# Patient Record
Sex: Female | Born: 1945
Health system: Southern US, Community
[De-identification: ages and names within clinical notes are randomized; demographics above are authoritative.]

## PROBLEM LIST (undated history)

## (undated) DIAGNOSIS — J40 Bronchitis, not specified as acute or chronic: Secondary | ICD-10-CM

## (undated) DIAGNOSIS — F329 Major depressive disorder, single episode, unspecified: Secondary | ICD-10-CM

## (undated) DIAGNOSIS — M797 Fibromyalgia: Secondary | ICD-10-CM

## (undated) DIAGNOSIS — F32A Depression, unspecified: Secondary | ICD-10-CM

## (undated) HISTORY — DX: Major depressive disorder, single episode, unspecified: F32.9

## (undated) HISTORY — DX: Bronchitis, not specified as acute or chronic: J40

## (undated) HISTORY — DX: Depression, unspecified: F32.A

---

## 1980-12-02 HISTORY — PX: TOTAL ABDOMINAL HYSTERECTOMY: SHX209

## 1998-08-09 ENCOUNTER — Other Ambulatory Visit: Admission: RE | Admit: 1998-08-09 | Discharge: 1998-08-09 | Payer: Self-pay | Admitting: Gynecology

## 2000-05-05 ENCOUNTER — Encounter: Admission: RE | Admit: 2000-05-05 | Discharge: 2000-05-05 | Payer: Self-pay | Admitting: Gynecology

## 2000-05-05 ENCOUNTER — Encounter: Payer: Self-pay | Admitting: Gynecology

## 2000-11-10 ENCOUNTER — Other Ambulatory Visit: Admission: RE | Admit: 2000-11-10 | Discharge: 2000-11-10 | Payer: Self-pay | Admitting: Gynecology

## 2001-02-13 ENCOUNTER — Ambulatory Visit (HOSPITAL_COMMUNITY): Admission: RE | Admit: 2001-02-13 | Discharge: 2001-02-13 | Payer: Self-pay | Admitting: Gastroenterology

## 2001-06-11 ENCOUNTER — Encounter: Admission: RE | Admit: 2001-06-11 | Discharge: 2001-06-11 | Payer: Self-pay | Admitting: Gynecology

## 2001-06-11 ENCOUNTER — Encounter: Payer: Self-pay | Admitting: Gynecology

## 2001-11-11 ENCOUNTER — Other Ambulatory Visit: Admission: RE | Admit: 2001-11-11 | Discharge: 2001-11-11 | Payer: Self-pay | Admitting: Gynecology

## 2002-07-02 ENCOUNTER — Encounter: Payer: Self-pay | Admitting: Gynecology

## 2002-07-02 ENCOUNTER — Encounter: Admission: RE | Admit: 2002-07-02 | Discharge: 2002-07-02 | Payer: Self-pay | Admitting: Gynecology

## 2002-12-15 ENCOUNTER — Other Ambulatory Visit: Admission: RE | Admit: 2002-12-15 | Discharge: 2002-12-15 | Payer: Self-pay | Admitting: Gynecology

## 2004-01-17 ENCOUNTER — Other Ambulatory Visit: Admission: RE | Admit: 2004-01-17 | Discharge: 2004-01-17 | Payer: Self-pay | Admitting: Gynecology

## 2005-04-25 ENCOUNTER — Other Ambulatory Visit: Admission: RE | Admit: 2005-04-25 | Discharge: 2005-04-25 | Payer: Self-pay | Admitting: Gynecology

## 2005-06-03 ENCOUNTER — Encounter: Admission: RE | Admit: 2005-06-03 | Discharge: 2005-06-03 | Payer: Self-pay | Admitting: Gynecology

## 2006-06-10 ENCOUNTER — Other Ambulatory Visit: Admission: RE | Admit: 2006-06-10 | Discharge: 2006-06-10 | Payer: Self-pay | Admitting: Gynecology

## 2006-07-02 ENCOUNTER — Encounter: Admission: RE | Admit: 2006-07-02 | Discharge: 2006-07-02 | Payer: Self-pay | Admitting: Gynecology

## 2006-12-02 HISTORY — PX: HAND SURGERY: SHX662

## 2007-04-19 ENCOUNTER — Emergency Department (HOSPITAL_COMMUNITY): Admission: EM | Admit: 2007-04-19 | Discharge: 2007-04-19 | Payer: Self-pay | Admitting: Emergency Medicine

## 2007-08-24 ENCOUNTER — Encounter: Admission: RE | Admit: 2007-08-24 | Discharge: 2007-08-24 | Payer: Self-pay | Admitting: Gynecology

## 2008-09-08 ENCOUNTER — Encounter: Admission: RE | Admit: 2008-09-08 | Discharge: 2008-09-08 | Payer: Self-pay | Admitting: Gynecology

## 2009-05-24 ENCOUNTER — Encounter: Admission: RE | Admit: 2009-05-24 | Discharge: 2009-05-24 | Payer: Self-pay | Admitting: Family Medicine

## 2009-09-14 ENCOUNTER — Encounter: Admission: RE | Admit: 2009-09-14 | Discharge: 2009-09-14 | Payer: Self-pay | Admitting: Gynecology

## 2010-10-05 ENCOUNTER — Encounter: Admission: RE | Admit: 2010-10-05 | Discharge: 2010-10-05 | Payer: Self-pay | Admitting: Gynecology

## 2010-12-02 HISTORY — PX: HAND SURGERY: SHX662

## 2010-12-24 ENCOUNTER — Encounter: Payer: Self-pay | Admitting: Gynecology

## 2011-04-19 NOTE — Procedures (Signed)
[place]  Patient:    Julie Duran, Julie Duran                      MRN: [id number] Proc. Date: 02/13/01 Adm. Date:  [id number] Attending:  Charna Elizabeth CC:         Dario Guardian, M.D.   Procedure Report  DATE OF BIRTH:  09-09-1946.  PROCEDURE:  Colonoscopy.  ENDOSCOPIST:  Anselmo Rod, M.D.  INSTRUMENTS USED:  Olympus video colonoscope.  INDICATIONS:  Guaiac positive stools with a history of rectal bleeding x 41 in a 65 year old white female with a family history of colon cancer in a first degree relative (father), rule out colonic polyps, masses, hemorrhoids, etc.  INFORMED CONSENT:  Informed consent was procured from the patient.  PREPROCEDURE PREPARATION:  The patient was fasted for eight hours prior to the procedure after being prepped with a bottle of magnesium citrate and a gallon of NuLytely the night prior to the procedure.  PREPROCEDURE PHYSICAL EXAMINATION:  VITAL SIGNS:  The patient had stable vital signs.  NECK:  Neck is supple.  CHEST:  Clear to auscultation.  S1, S2 regular.  ABDOMEN:  Soft with normal abdominal bowel sounds.  DESCRIPTION OF PROCEDURE:  The patient was placed in the left lateral decubitus position and sedated with 50 mg of Demerol and 5 mg of Versed intravenously.  Once the patient was adequately sedated and maintained on low flow oxygen, and continuous cardiac monitoring, the Olympus video colonoscope was advanced from the rectum to the cecum without difficulty.  The patient had a fairly good prep.  The appendicular orifice and ileocecal valve were clearly visualized and photographed.  No masses, polyps, erosions, ulcerations or diverticular were seen.  There were small internal hemorrhoids seen on retroflexion in the rectum.  IMPRESSION:  Normal colonoscopy except for small nonbleeding internal hemorrhoids.  RECOMMENDATIONS: 1. The patient has been advised to increase her fluid and fiber in  the diet. 2. Repeat colorectal cancer screening is recommended in the next 5 years    unless she were to develop any abnormal symptoms in the interim. 3. Outpatient followup is advised on a p.r.n. basis. DD:  02/13/01 TD:  02/13/01 Job: [id number]

## 2011-08-22 ENCOUNTER — Ambulatory Visit
Admission: RE | Admit: 2011-08-22 | Discharge: 2011-08-22 | Disposition: A | Discharge status: Home / Self Care | Payer: Medicare Other | Source: Ambulatory Visit | Attending: Family Medicine | Admitting: Family Medicine

## 2011-08-22 ENCOUNTER — Other Ambulatory Visit: Payer: Self-pay | Admitting: Family Medicine

## 2011-08-22 DIAGNOSIS — R109 Unspecified abdominal pain: Secondary | ICD-10-CM

## 2011-12-17 ENCOUNTER — Other Ambulatory Visit: Payer: Self-pay | Admitting: Gynecology

## 2011-12-17 DIAGNOSIS — Z1231 Encounter for screening mammogram for malignant neoplasm of breast: Secondary | ICD-10-CM

## 2012-01-01 ENCOUNTER — Ambulatory Visit
Admission: RE | Admit: 2012-01-01 | Discharge: 2012-01-01 | Disposition: A | Discharge status: Home / Self Care | Payer: Medicare Other | Source: Ambulatory Visit | Attending: Gynecology | Admitting: Gynecology

## 2012-01-01 DIAGNOSIS — Z1231 Encounter for screening mammogram for malignant neoplasm of breast: Secondary | ICD-10-CM

## 2012-02-27 DIAGNOSIS — H269 Unspecified cataract: Secondary | ICD-10-CM | POA: Diagnosis not present

## 2012-04-27 DIAGNOSIS — J019 Acute sinusitis, unspecified: Secondary | ICD-10-CM | POA: Diagnosis not present

## 2012-05-06 DIAGNOSIS — M25569 Pain in unspecified knee: Secondary | ICD-10-CM | POA: Diagnosis not present

## 2012-05-06 DIAGNOSIS — M25579 Pain in unspecified ankle and joints of unspecified foot: Secondary | ICD-10-CM | POA: Diagnosis not present

## 2012-08-06 DIAGNOSIS — S63639A Sprain of interphalangeal joint of unspecified finger, initial encounter: Secondary | ICD-10-CM | POA: Diagnosis not present

## 2012-08-20 DIAGNOSIS — S63639A Sprain of interphalangeal joint of unspecified finger, initial encounter: Secondary | ICD-10-CM | POA: Diagnosis not present

## 2012-09-14 DIAGNOSIS — Z23 Encounter for immunization: Secondary | ICD-10-CM | POA: Diagnosis not present

## 2012-09-14 DIAGNOSIS — IMO0001 Reserved for inherently not codable concepts without codable children: Secondary | ICD-10-CM | POA: Diagnosis not present

## 2012-09-14 DIAGNOSIS — E785 Hyperlipidemia, unspecified: Secondary | ICD-10-CM | POA: Diagnosis not present

## 2012-09-14 DIAGNOSIS — F329 Major depressive disorder, single episode, unspecified: Secondary | ICD-10-CM | POA: Diagnosis not present

## 2012-09-30 DIAGNOSIS — E2839 Other primary ovarian failure: Secondary | ICD-10-CM | POA: Diagnosis not present

## 2012-09-30 DIAGNOSIS — Z01419 Encounter for gynecological examination (general) (routine) without abnormal findings: Secondary | ICD-10-CM | POA: Diagnosis not present

## 2012-09-30 DIAGNOSIS — F329 Major depressive disorder, single episode, unspecified: Secondary | ICD-10-CM | POA: Diagnosis not present

## 2012-12-29 ENCOUNTER — Other Ambulatory Visit: Payer: Self-pay | Admitting: Gynecology

## 2012-12-29 DIAGNOSIS — Z1231 Encounter for screening mammogram for malignant neoplasm of breast: Secondary | ICD-10-CM

## 2013-01-22 ENCOUNTER — Ambulatory Visit
Admission: RE | Admit: 2013-01-22 | Discharge: 2013-01-22 | Disposition: A | Discharge status: Home / Self Care | Payer: Medicare Other | Source: Ambulatory Visit | Attending: Gynecology | Admitting: Gynecology

## 2013-01-22 DIAGNOSIS — Z1231 Encounter for screening mammogram for malignant neoplasm of breast: Secondary | ICD-10-CM

## 2013-03-04 ENCOUNTER — Other Ambulatory Visit: Payer: Self-pay

## 2013-03-04 DIAGNOSIS — Z1231 Encounter for screening mammogram for malignant neoplasm of breast: Secondary | ICD-10-CM

## 2013-04-07 ENCOUNTER — Ambulatory Visit
Admission: RE | Admit: 2013-04-07 | Discharge: 2013-04-07 | Disposition: A | Discharge status: Home / Self Care | Payer: Medicare Other | Source: Ambulatory Visit

## 2013-04-07 ENCOUNTER — Other Ambulatory Visit: Payer: Self-pay | Admitting: Family Medicine

## 2013-04-07 DIAGNOSIS — Z1231 Encounter for screening mammogram for malignant neoplasm of breast: Secondary | ICD-10-CM | POA: Diagnosis not present

## 2013-04-07 DIAGNOSIS — R928 Other abnormal and inconclusive findings on diagnostic imaging of breast: Secondary | ICD-10-CM

## 2013-04-12 DIAGNOSIS — L74519 Primary focal hyperhidrosis, unspecified: Secondary | ICD-10-CM | POA: Diagnosis not present

## 2013-04-12 DIAGNOSIS — B354 Tinea corporis: Secondary | ICD-10-CM | POA: Diagnosis not present

## 2013-04-20 ENCOUNTER — Other Ambulatory Visit: Payer: Medicare Other

## 2013-04-21 ENCOUNTER — Ambulatory Visit
Admission: RE | Admit: 2013-04-21 | Discharge: 2013-04-21 | Disposition: A | Discharge status: Home / Self Care | Payer: Medicare Other | Source: Ambulatory Visit | Attending: Family Medicine | Admitting: Family Medicine

## 2013-04-21 DIAGNOSIS — R928 Other abnormal and inconclusive findings on diagnostic imaging of breast: Secondary | ICD-10-CM | POA: Diagnosis not present

## 2013-07-29 DIAGNOSIS — H524 Presbyopia: Secondary | ICD-10-CM | POA: Diagnosis not present

## 2013-07-29 DIAGNOSIS — H269 Unspecified cataract: Secondary | ICD-10-CM | POA: Diagnosis not present

## 2013-07-29 DIAGNOSIS — H52229 Regular astigmatism, unspecified eye: Secondary | ICD-10-CM | POA: Diagnosis not present

## 2013-07-29 DIAGNOSIS — H5210 Myopia, unspecified eye: Secondary | ICD-10-CM | POA: Diagnosis not present

## 2013-08-02 DIAGNOSIS — IMO0001 Reserved for inherently not codable concepts without codable children: Secondary | ICD-10-CM | POA: Diagnosis not present

## 2013-08-31 DIAGNOSIS — IMO0001 Reserved for inherently not codable concepts without codable children: Secondary | ICD-10-CM | POA: Diagnosis not present

## 2013-08-31 DIAGNOSIS — F329 Major depressive disorder, single episode, unspecified: Secondary | ICD-10-CM | POA: Diagnosis not present

## 2013-08-31 DIAGNOSIS — E785 Hyperlipidemia, unspecified: Secondary | ICD-10-CM | POA: Diagnosis not present

## 2013-09-09 DIAGNOSIS — L259 Unspecified contact dermatitis, unspecified cause: Secondary | ICD-10-CM | POA: Diagnosis not present

## 2013-09-09 DIAGNOSIS — L821 Other seborrheic keratosis: Secondary | ICD-10-CM | POA: Diagnosis not present

## 2013-09-15 DIAGNOSIS — Z23 Encounter for immunization: Secondary | ICD-10-CM | POA: Diagnosis not present

## 2013-09-15 DIAGNOSIS — B373 Candidiasis of vulva and vagina: Secondary | ICD-10-CM | POA: Diagnosis not present

## 2013-12-15 DIAGNOSIS — M899 Disorder of bone, unspecified: Secondary | ICD-10-CM | POA: Diagnosis not present

## 2013-12-15 DIAGNOSIS — N951 Menopausal and female climacteric states: Secondary | ICD-10-CM | POA: Diagnosis not present

## 2013-12-15 DIAGNOSIS — Z01419 Encounter for gynecological examination (general) (routine) without abnormal findings: Secondary | ICD-10-CM | POA: Diagnosis not present

## 2013-12-15 DIAGNOSIS — Z79890 Hormone replacement therapy (postmenopausal): Secondary | ICD-10-CM | POA: Diagnosis not present

## 2014-04-20 DIAGNOSIS — F329 Major depressive disorder, single episode, unspecified: Secondary | ICD-10-CM | POA: Diagnosis not present

## 2014-04-20 DIAGNOSIS — Z0000 Encounter for general adult medical examination without abnormal findings: Secondary | ICD-10-CM | POA: Diagnosis not present

## 2014-04-20 DIAGNOSIS — E785 Hyperlipidemia, unspecified: Secondary | ICD-10-CM | POA: Diagnosis not present

## 2014-04-20 DIAGNOSIS — R5381 Other malaise: Secondary | ICD-10-CM | POA: Diagnosis not present

## 2014-04-20 DIAGNOSIS — IMO0001 Reserved for inherently not codable concepts without codable children: Secondary | ICD-10-CM | POA: Diagnosis not present

## 2014-04-20 DIAGNOSIS — F3289 Other specified depressive episodes: Secondary | ICD-10-CM | POA: Diagnosis not present

## 2014-04-29 ENCOUNTER — Other Ambulatory Visit: Payer: Self-pay

## 2014-04-29 DIAGNOSIS — Z1231 Encounter for screening mammogram for malignant neoplasm of breast: Secondary | ICD-10-CM

## 2014-05-04 ENCOUNTER — Ambulatory Visit
Admission: RE | Admit: 2014-05-04 | Discharge: 2014-05-04 | Disposition: A | Discharge status: Home / Self Care | Payer: Medicare Other | Source: Ambulatory Visit

## 2014-05-04 DIAGNOSIS — Z1231 Encounter for screening mammogram for malignant neoplasm of breast: Secondary | ICD-10-CM

## 2014-05-09 IMAGING — MG MM DIGITAL SCREENING BILAT
4 series · 4 of 4 positions shown · non-contrast
Comparison: compared with prior film

CLINICAL DATA: Screening.

DIGITAL BILATERAL SCREENING MAMMOGRAM WITH CAD

[R CC]
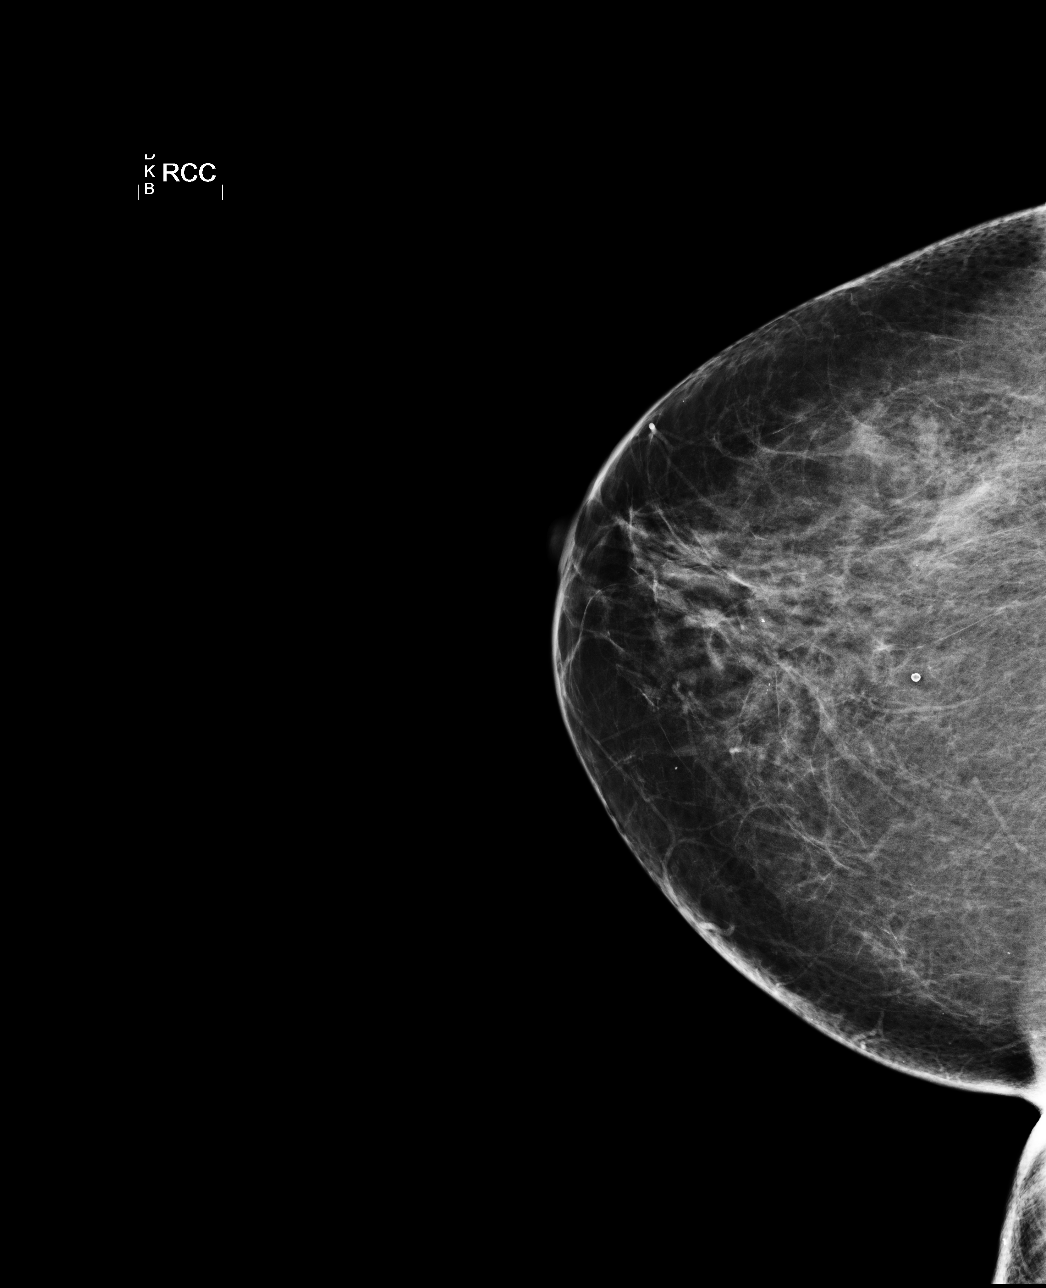

[L CC]
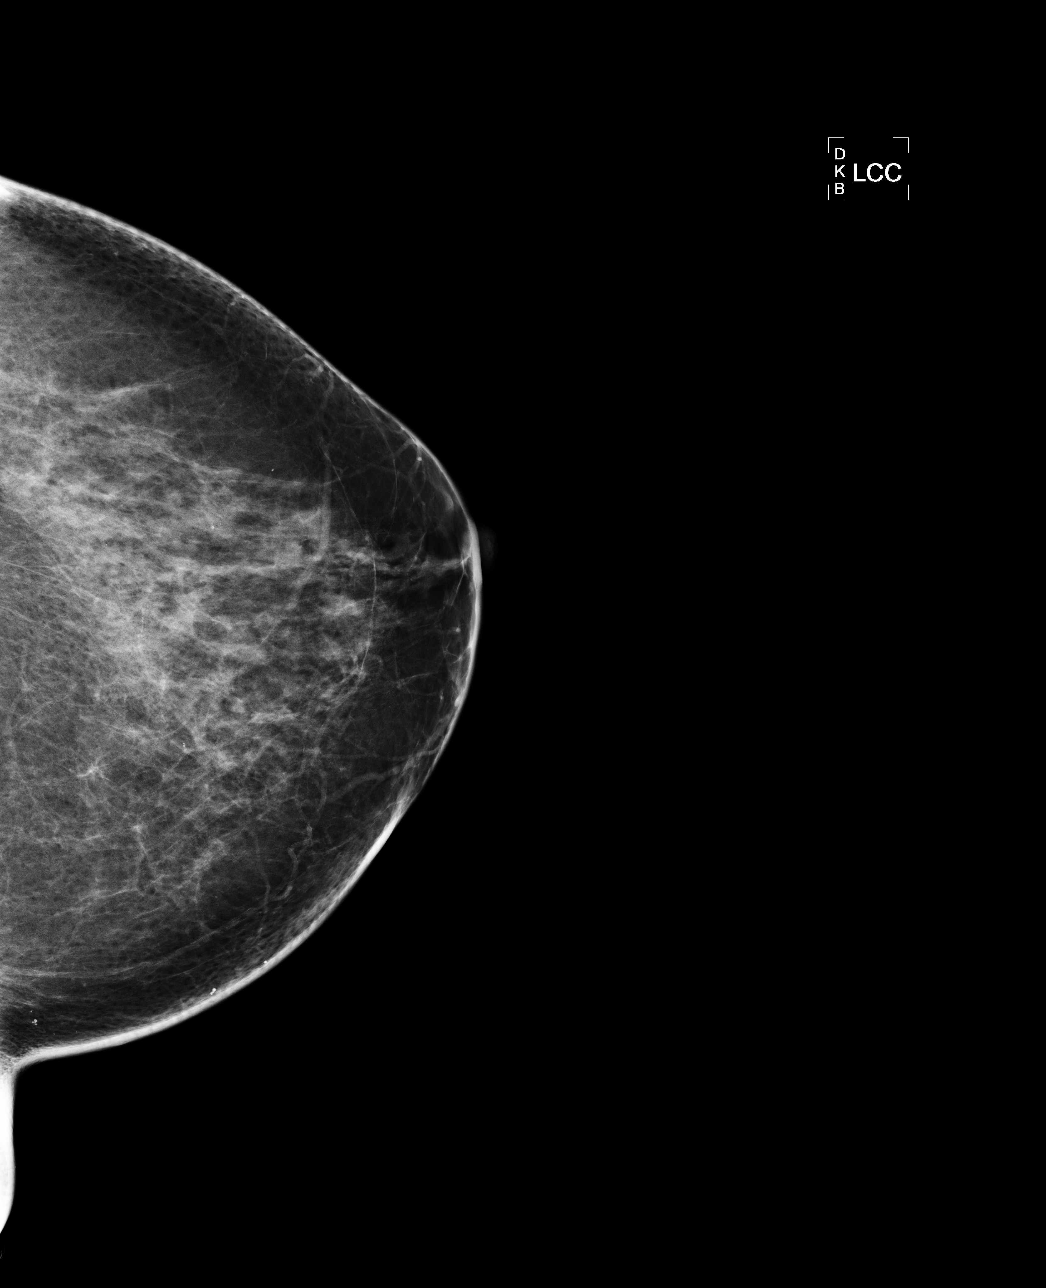

[L MLO]
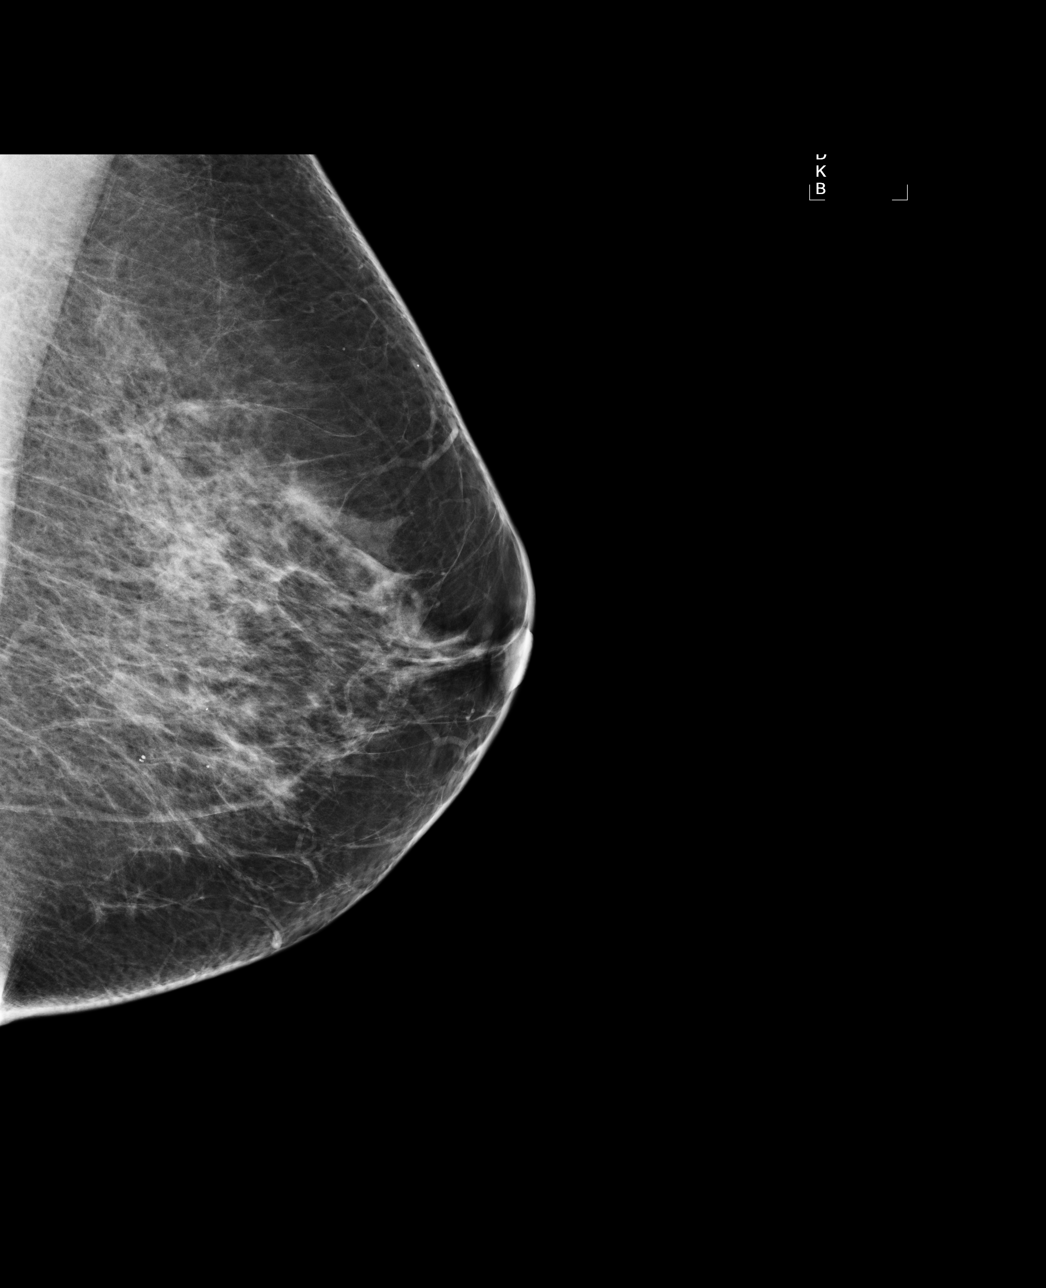

[R MLO]
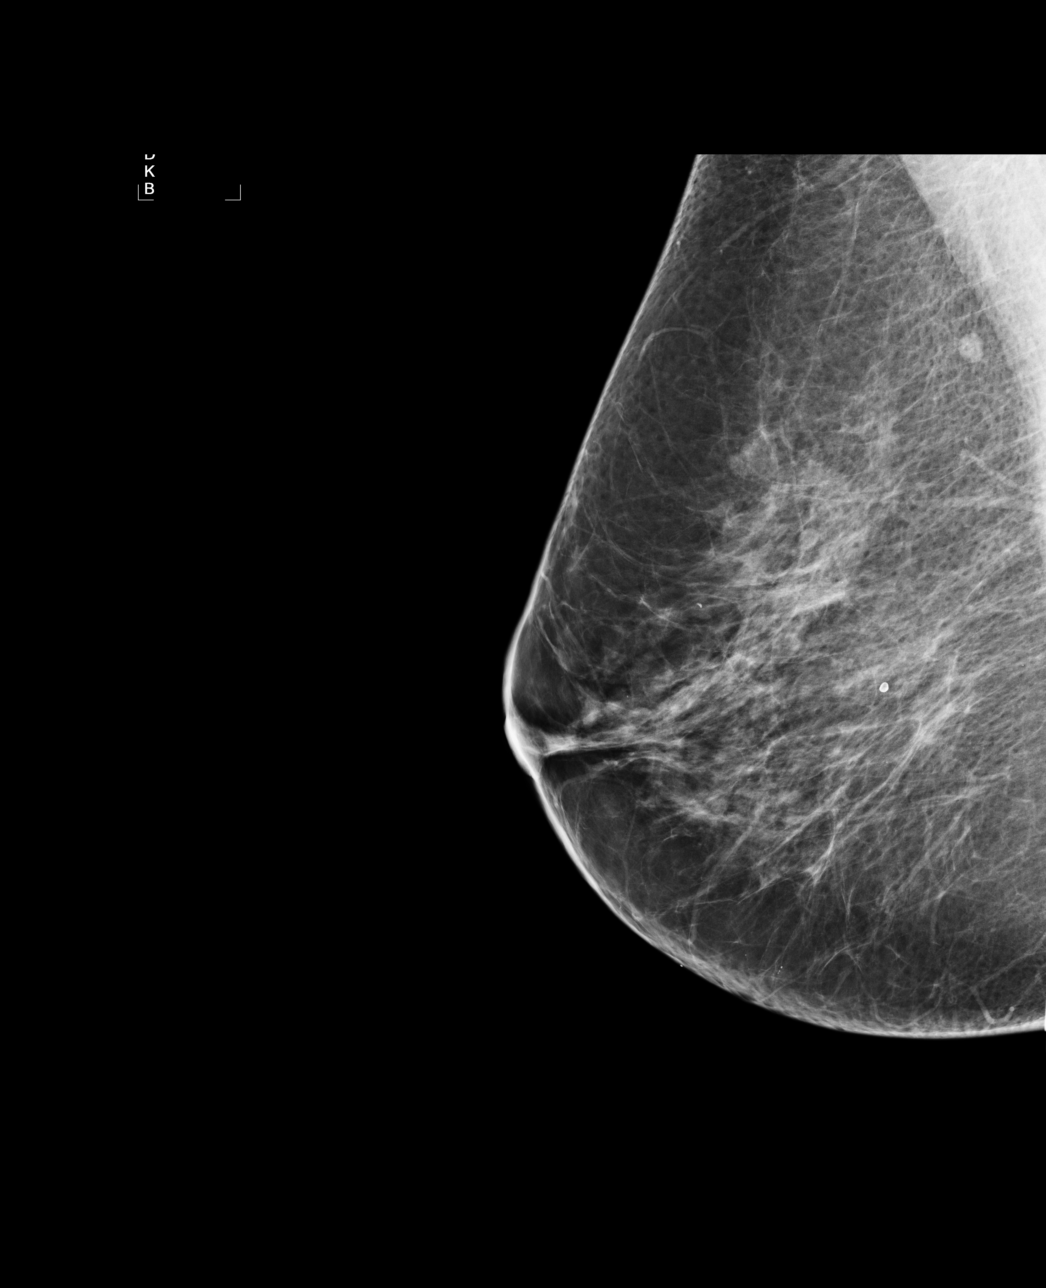

[4 of 4 positions shown; findings below may reference images not displayed]

FINDINGS: ACR Breast Density Category 3:The breast tissue is heterogeneously
dense.

In the right breast, a possible mass warrants further evaluation
with spot compression views and possibly ultrasound.  In the left
breast, no masses or malignant type calcifications are identified.

Images were processed with CAD.
IMPRESSION: Further evaluation is suggested for possible mass in the right
breast.

RECOMMENDATION:
Diagnostic mammogram and possibly ultrasound of the right breast.
(Code:[id number])

The patient will be contacted regarding the findings, and
additional imaging will be scheduled.

BI-RADS CATEGORY 0:  Incomplete.  Need additional imaging
evaluation and/or prior mammograms for comparison.

## 2014-05-31 DIAGNOSIS — M25569 Pain in unspecified knee: Secondary | ICD-10-CM | POA: Diagnosis not present

## 2014-07-06 DIAGNOSIS — Z780 Asymptomatic menopausal state: Secondary | ICD-10-CM | POA: Diagnosis not present

## 2014-07-06 DIAGNOSIS — N951 Menopausal and female climacteric states: Secondary | ICD-10-CM | POA: Diagnosis not present

## 2014-09-28 DIAGNOSIS — Z1211 Encounter for screening for malignant neoplasm of colon: Secondary | ICD-10-CM | POA: Diagnosis not present

## 2015-01-18 DIAGNOSIS — M797 Fibromyalgia: Secondary | ICD-10-CM | POA: Diagnosis not present

## 2015-01-18 DIAGNOSIS — E785 Hyperlipidemia, unspecified: Secondary | ICD-10-CM | POA: Diagnosis not present

## 2015-01-18 DIAGNOSIS — F329 Major depressive disorder, single episode, unspecified: Secondary | ICD-10-CM | POA: Diagnosis not present

## 2015-01-24 DIAGNOSIS — H2513 Age-related nuclear cataract, bilateral: Secondary | ICD-10-CM | POA: Diagnosis not present

## 2015-06-28 DIAGNOSIS — Z79890 Hormone replacement therapy (postmenopausal): Secondary | ICD-10-CM | POA: Diagnosis not present

## 2015-06-28 DIAGNOSIS — Z780 Asymptomatic menopausal state: Secondary | ICD-10-CM | POA: Diagnosis not present

## 2015-06-28 DIAGNOSIS — Z01419 Encounter for gynecological examination (general) (routine) without abnormal findings: Secondary | ICD-10-CM | POA: Diagnosis not present

## 2015-06-28 DIAGNOSIS — Z1231 Encounter for screening mammogram for malignant neoplasm of breast: Secondary | ICD-10-CM | POA: Diagnosis not present

## 2015-10-03 DIAGNOSIS — Z0001 Encounter for general adult medical examination with abnormal findings: Secondary | ICD-10-CM | POA: Diagnosis not present

## 2015-10-03 DIAGNOSIS — F329 Major depressive disorder, single episode, unspecified: Secondary | ICD-10-CM | POA: Diagnosis not present

## 2015-10-03 DIAGNOSIS — M797 Fibromyalgia: Secondary | ICD-10-CM | POA: Diagnosis not present

## 2015-10-03 DIAGNOSIS — E785 Hyperlipidemia, unspecified: Secondary | ICD-10-CM | POA: Diagnosis not present

## 2015-10-03 DIAGNOSIS — S8011XA Contusion of right lower leg, initial encounter: Secondary | ICD-10-CM | POA: Diagnosis not present

## 2015-10-03 DIAGNOSIS — Z1389 Encounter for screening for other disorder: Secondary | ICD-10-CM | POA: Diagnosis not present

## 2015-10-03 DIAGNOSIS — Z23 Encounter for immunization: Secondary | ICD-10-CM | POA: Diagnosis not present

## 2016-01-22 DIAGNOSIS — H0012 Chalazion right lower eyelid: Secondary | ICD-10-CM | POA: Diagnosis not present

## 2016-02-15 DIAGNOSIS — N9089 Other specified noninflammatory disorders of vulva and perineum: Secondary | ICD-10-CM | POA: Diagnosis not present

## 2016-02-15 DIAGNOSIS — R3915 Urgency of urination: Secondary | ICD-10-CM | POA: Diagnosis not present

## 2016-03-07 DIAGNOSIS — L570 Actinic keratosis: Secondary | ICD-10-CM | POA: Diagnosis not present

## 2016-03-07 DIAGNOSIS — D1801 Hemangioma of skin and subcutaneous tissue: Secondary | ICD-10-CM | POA: Diagnosis not present

## 2016-03-07 DIAGNOSIS — L821 Other seborrheic keratosis: Secondary | ICD-10-CM | POA: Diagnosis not present

## 2016-03-07 DIAGNOSIS — L812 Freckles: Secondary | ICD-10-CM | POA: Diagnosis not present

## 2016-03-07 DIAGNOSIS — L308 Other specified dermatitis: Secondary | ICD-10-CM | POA: Diagnosis not present

## 2016-05-08 DIAGNOSIS — R944 Abnormal results of kidney function studies: Secondary | ICD-10-CM | POA: Diagnosis not present

## 2016-05-08 DIAGNOSIS — F325 Major depressive disorder, single episode, in full remission: Secondary | ICD-10-CM | POA: Diagnosis not present

## 2016-05-08 DIAGNOSIS — M797 Fibromyalgia: Secondary | ICD-10-CM | POA: Diagnosis not present

## 2016-05-08 DIAGNOSIS — E785 Hyperlipidemia, unspecified: Secondary | ICD-10-CM | POA: Diagnosis not present

## 2016-08-01 DIAGNOSIS — H5213 Myopia, bilateral: Secondary | ICD-10-CM | POA: Diagnosis not present

## 2016-08-01 DIAGNOSIS — H524 Presbyopia: Secondary | ICD-10-CM | POA: Diagnosis not present

## 2016-08-01 DIAGNOSIS — H2513 Age-related nuclear cataract, bilateral: Secondary | ICD-10-CM | POA: Diagnosis not present

## 2016-08-22 DIAGNOSIS — Z1211 Encounter for screening for malignant neoplasm of colon: Secondary | ICD-10-CM | POA: Diagnosis not present

## 2016-08-22 DIAGNOSIS — R142 Eructation: Secondary | ICD-10-CM | POA: Diagnosis not present

## 2016-08-22 DIAGNOSIS — Z800 Family history of malignant neoplasm of digestive organs: Secondary | ICD-10-CM | POA: Diagnosis not present

## 2016-08-22 DIAGNOSIS — K5900 Constipation, unspecified: Secondary | ICD-10-CM | POA: Diagnosis not present

## 2016-09-02 DIAGNOSIS — Z1211 Encounter for screening for malignant neoplasm of colon: Secondary | ICD-10-CM | POA: Diagnosis not present

## 2016-09-02 DIAGNOSIS — Z800 Family history of malignant neoplasm of digestive organs: Secondary | ICD-10-CM | POA: Diagnosis not present

## 2016-09-02 DIAGNOSIS — K5730 Diverticulosis of large intestine without perforation or abscess without bleeding: Secondary | ICD-10-CM | POA: Diagnosis not present

## 2016-09-03 DIAGNOSIS — Z79890 Hormone replacement therapy (postmenopausal): Secondary | ICD-10-CM | POA: Diagnosis not present

## 2016-09-03 DIAGNOSIS — Z780 Asymptomatic menopausal state: Secondary | ICD-10-CM | POA: Diagnosis not present

## 2016-09-03 DIAGNOSIS — Z01419 Encounter for gynecological examination (general) (routine) without abnormal findings: Secondary | ICD-10-CM | POA: Diagnosis not present

## 2016-09-03 DIAGNOSIS — Z1231 Encounter for screening mammogram for malignant neoplasm of breast: Secondary | ICD-10-CM | POA: Diagnosis not present

## 2016-09-17 DIAGNOSIS — J029 Acute pharyngitis, unspecified: Secondary | ICD-10-CM | POA: Diagnosis not present

## 2016-11-12 DIAGNOSIS — E785 Hyperlipidemia, unspecified: Secondary | ICD-10-CM | POA: Diagnosis not present

## 2016-11-12 DIAGNOSIS — F5101 Primary insomnia: Secondary | ICD-10-CM | POA: Diagnosis not present

## 2016-11-12 DIAGNOSIS — M797 Fibromyalgia: Secondary | ICD-10-CM | POA: Diagnosis not present

## 2017-03-10 DIAGNOSIS — L438 Other lichen planus: Secondary | ICD-10-CM | POA: Diagnosis not present

## 2017-03-10 DIAGNOSIS — D2271 Melanocytic nevi of right lower limb, including hip: Secondary | ICD-10-CM | POA: Diagnosis not present

## 2017-03-10 DIAGNOSIS — L812 Freckles: Secondary | ICD-10-CM | POA: Diagnosis not present

## 2017-03-10 DIAGNOSIS — L821 Other seborrheic keratosis: Secondary | ICD-10-CM | POA: Diagnosis not present

## 2017-03-10 DIAGNOSIS — D1801 Hemangioma of skin and subcutaneous tissue: Secondary | ICD-10-CM | POA: Diagnosis not present

## 2017-03-10 DIAGNOSIS — L74512 Primary focal hyperhidrosis, palms: Secondary | ICD-10-CM | POA: Diagnosis not present

## 2017-04-02 DIAGNOSIS — M797 Fibromyalgia: Secondary | ICD-10-CM | POA: Diagnosis not present

## 2017-04-02 DIAGNOSIS — F325 Major depressive disorder, single episode, in full remission: Secondary | ICD-10-CM | POA: Diagnosis not present

## 2017-04-02 DIAGNOSIS — M25551 Pain in right hip: Secondary | ICD-10-CM | POA: Diagnosis not present

## 2017-05-13 DIAGNOSIS — F325 Major depressive disorder, single episode, in full remission: Secondary | ICD-10-CM | POA: Diagnosis not present

## 2017-05-13 DIAGNOSIS — M797 Fibromyalgia: Secondary | ICD-10-CM | POA: Diagnosis not present

## 2017-05-13 DIAGNOSIS — F5101 Primary insomnia: Secondary | ICD-10-CM | POA: Diagnosis not present

## 2017-05-23 DIAGNOSIS — R32 Unspecified urinary incontinence: Secondary | ICD-10-CM | POA: Diagnosis not present

## 2017-05-23 DIAGNOSIS — N3289 Other specified disorders of bladder: Secondary | ICD-10-CM | POA: Diagnosis not present

## 2017-05-23 DIAGNOSIS — R390 Extravasation of urine: Secondary | ICD-10-CM | POA: Diagnosis not present

## 2017-08-06 DIAGNOSIS — M7062 Trochanteric bursitis, left hip: Secondary | ICD-10-CM | POA: Diagnosis not present

## 2017-08-06 DIAGNOSIS — M7061 Trochanteric bursitis, right hip: Secondary | ICD-10-CM | POA: Diagnosis not present

## 2017-08-12 DIAGNOSIS — M7061 Trochanteric bursitis, right hip: Secondary | ICD-10-CM | POA: Diagnosis not present

## 2017-08-12 DIAGNOSIS — M7062 Trochanteric bursitis, left hip: Secondary | ICD-10-CM | POA: Diagnosis not present

## 2017-08-12 DIAGNOSIS — M5416 Radiculopathy, lumbar region: Secondary | ICD-10-CM | POA: Diagnosis not present

## 2017-08-18 DIAGNOSIS — M5416 Radiculopathy, lumbar region: Secondary | ICD-10-CM | POA: Diagnosis not present

## 2017-08-21 DIAGNOSIS — H5213 Myopia, bilateral: Secondary | ICD-10-CM | POA: Diagnosis not present

## 2017-08-21 DIAGNOSIS — H52221 Regular astigmatism, right eye: Secondary | ICD-10-CM | POA: Diagnosis not present

## 2017-08-21 DIAGNOSIS — H524 Presbyopia: Secondary | ICD-10-CM | POA: Diagnosis not present

## 2017-08-21 DIAGNOSIS — H2513 Age-related nuclear cataract, bilateral: Secondary | ICD-10-CM | POA: Diagnosis not present

## 2017-08-25 DIAGNOSIS — M5416 Radiculopathy, lumbar region: Secondary | ICD-10-CM | POA: Diagnosis not present

## 2017-09-01 DIAGNOSIS — M5416 Radiculopathy, lumbar region: Secondary | ICD-10-CM | POA: Diagnosis not present

## 2017-09-08 DIAGNOSIS — M5416 Radiculopathy, lumbar region: Secondary | ICD-10-CM | POA: Diagnosis not present

## 2017-09-23 DIAGNOSIS — M7062 Trochanteric bursitis, left hip: Secondary | ICD-10-CM | POA: Diagnosis not present

## 2017-09-23 DIAGNOSIS — M7061 Trochanteric bursitis, right hip: Secondary | ICD-10-CM | POA: Diagnosis not present

## 2017-09-23 DIAGNOSIS — M5416 Radiculopathy, lumbar region: Secondary | ICD-10-CM | POA: Diagnosis not present

## 2017-10-07 DIAGNOSIS — M5416 Radiculopathy, lumbar region: Secondary | ICD-10-CM | POA: Diagnosis not present

## 2017-10-15 DIAGNOSIS — M5416 Radiculopathy, lumbar region: Secondary | ICD-10-CM | POA: Diagnosis not present

## 2017-10-27 DIAGNOSIS — M5416 Radiculopathy, lumbar region: Secondary | ICD-10-CM | POA: Diagnosis not present

## 2017-10-30 DIAGNOSIS — Z01419 Encounter for gynecological examination (general) (routine) without abnormal findings: Secondary | ICD-10-CM | POA: Diagnosis not present

## 2017-10-30 DIAGNOSIS — Z1231 Encounter for screening mammogram for malignant neoplasm of breast: Secondary | ICD-10-CM | POA: Diagnosis not present

## 2017-10-30 DIAGNOSIS — Z79890 Hormone replacement therapy (postmenopausal): Secondary | ICD-10-CM | POA: Diagnosis not present

## 2017-10-30 DIAGNOSIS — Z780 Asymptomatic menopausal state: Secondary | ICD-10-CM | POA: Diagnosis not present

## 2017-11-27 DIAGNOSIS — F5101 Primary insomnia: Secondary | ICD-10-CM | POA: Diagnosis not present

## 2017-11-27 DIAGNOSIS — G8929 Other chronic pain: Secondary | ICD-10-CM | POA: Diagnosis not present

## 2017-11-27 DIAGNOSIS — Z0001 Encounter for general adult medical examination with abnormal findings: Secondary | ICD-10-CM | POA: Diagnosis not present

## 2017-11-27 DIAGNOSIS — M797 Fibromyalgia: Secondary | ICD-10-CM | POA: Diagnosis not present

## 2017-11-27 DIAGNOSIS — M545 Low back pain: Secondary | ICD-10-CM | POA: Diagnosis not present

## 2017-11-27 DIAGNOSIS — F325 Major depressive disorder, single episode, in full remission: Secondary | ICD-10-CM | POA: Diagnosis not present

## 2017-11-27 DIAGNOSIS — Z1389 Encounter for screening for other disorder: Secondary | ICD-10-CM | POA: Diagnosis not present

## 2017-11-27 DIAGNOSIS — E785 Hyperlipidemia, unspecified: Secondary | ICD-10-CM | POA: Diagnosis not present

## 2017-12-17 ENCOUNTER — Encounter (INDEPENDENT_AMBULATORY_CARE_PROVIDER_SITE_OTHER): Payer: Self-pay | Admitting: Physical Medicine and Rehabilitation

## 2017-12-17 ENCOUNTER — Ambulatory Visit (INDEPENDENT_AMBULATORY_CARE_PROVIDER_SITE_OTHER): Payer: Self-pay

## 2017-12-17 ENCOUNTER — Ambulatory Visit (INDEPENDENT_AMBULATORY_CARE_PROVIDER_SITE_OTHER): Payer: Medicare Other | Admitting: Physical Medicine and Rehabilitation

## 2017-12-17 VITALS — BP 132/84

## 2017-12-17 DIAGNOSIS — G8929 Other chronic pain: Secondary | ICD-10-CM

## 2017-12-17 DIAGNOSIS — R202 Paresthesia of skin: Secondary | ICD-10-CM | POA: Diagnosis not present

## 2017-12-17 DIAGNOSIS — M47816 Spondylosis without myelopathy or radiculopathy, lumbar region: Secondary | ICD-10-CM | POA: Diagnosis not present

## 2017-12-17 DIAGNOSIS — M5442 Lumbago with sciatica, left side: Secondary | ICD-10-CM | POA: Diagnosis not present

## 2017-12-17 NOTE — Progress Notes (Deleted)
Pt states lower aching back pain. Pt states pain has been there for about 3-4 mo. Pt has sharp pain in both legs with numbness in left toes. Pt states pain in both legs has been going on for about 2 months ago. Pt states being on her feet and sitting for a long period of time makes the pain worse. Pt states medication and heating pack makes pain better. Pt states PT at [place] that started September 2018, had 6 sessions. Pt stated PT helped at first, but not for long.

## 2017-12-23 ENCOUNTER — Telehealth (INDEPENDENT_AMBULATORY_CARE_PROVIDER_SITE_OTHER): Payer: Self-pay | Admitting: Physical Medicine and Rehabilitation

## 2017-12-23 DIAGNOSIS — M545 Low back pain: Principal | ICD-10-CM

## 2017-12-23 DIAGNOSIS — G8929 Other chronic pain: Secondary | ICD-10-CM

## 2017-12-23 NOTE — Telephone Encounter (Signed)
Yes, it should have been done/ ordered

## 2017-12-24 ENCOUNTER — Encounter (INDEPENDENT_AMBULATORY_CARE_PROVIDER_SITE_OTHER): Payer: Self-pay | Admitting: Physical Medicine and Rehabilitation

## 2017-12-24 NOTE — Progress Notes (Signed)
Julie Duran [facility] - 72 y.o. female MRN [id number]  Date of birth: 1946-11-16  Office Visit Note: Visit Date: 12/17/2017 PCP: Carol Ada, MD Referred by: No ref. provider found  Subjective: Chief Complaint  Patient presents with  . Lower Back - Pain  . Right Thigh - Pain  . Left Thigh - Pain  . 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  Comes in today really as a self-referral but she does see and is Tamala Julian, MD is her primary care physician and has seen physicians at her orthopedics for prior evaluation.  Her husband is also seen in [facility]ics by Dr. Rolena Infante as well as Dr. Nelva Bush.  Reports a severe aching lower back pain at the lumbosacral junction she reports initial onset 4 months ago sharp pain in both legs with some numbness in the left lateral toes and underneath the sole of the foot on the left side.  He does have some buttock pain on the left side.  He states that the initial pain 4 months ago was more back pain and the leg pain started around 2 months ago.  She reports worsening symptoms if she sits for a long time or if she stands for a long time.  Does seem to help.  He does take some medication which helps and heating pad seems to help she actually did have physical therapy agrees were orthopedic in September 2008 for 6 sessions.  States at first it seemed to help but then it did not seem to last for very long.  She reports some since it first happened but overall she is still limited in what she would like to be doing during the day.  She is really not limited in activities of daily living but otherwise has things that she would like to do that just she can do because of the pain.  She is never had any lumbar surgery or prior MRI.  She has had x-ray imaging of the lumbar spine which we did review today on a CD.  She has general spondylosis and some mild  scoliosis.  She has not noted any focal weakness or foot drop.  She has had no bowel or bladder changes or focal night pain or night sweats.  She has had no unintended weight loss.  She does carry a diagnosis of fibromyalgia which she has had for many years.  She is intolerant to certain medications.  She is intolerant of opioids in general.  Does take Flexeril as well as clonazepam small bit of gabapentin.    Review of Systems  Constitutional: Negative for chills, fever, malaise/fatigue and weight loss.  HENT: Negative for hearing loss and sinus pain.   Eyes: Negative for blurred vision, double vision and photophobia.  Respiratory: Negative for cough and shortness of breath.   Cardiovascular: Negative for chest pain, palpitations and leg swelling.  Gastrointestinal: Negative for abdominal pain, nausea and vomiting.  Genitourinary: Negative for flank pain.  Musculoskeletal: Positive for back pain. Negative for myalgias.  Skin: Negative for itching and rash.  Neurological: Positive for tingling. Negative for tremors, focal weakness and weakness.  Endo/Heme/Allergies: Negative.   Psychiatric/Behavioral: Negative for depression. The patient is nervous/anxious.   All other systems reviewed and are negative.  Otherwise per HPI.  Assessment & Plan: Visit Diagnoses:  1. Chronic bilateral low back pain with left-sided sciatica  2. Spondylosis without myelopathy or radiculopathy, lumbar region   3. Paresthesia of skin     Plan: Findings:  3-4 months of chronic severe at times low back pain radicular type pain in both hips that is sharp numbness in the left lower extremity more consistent with an S1 dermatome.  She has an equivocally positive slump test.  She does get worsening with prolonged sitting but also with prolonged standing.  This is likely related to probable disc herniation foraminal or extraforaminal paracentral at L5-S1.  I cannot rule out some stenosis with her age and findings of  facet arthropathy on x-ray from [place].  She is failed physical therapy.  She had 6 sessions throughout 2018 in the latter part of the fall did help at first but did not help very long.  I think the next best step is to obtain an MRI of the lumbar spine given the length of time this is been going on the fact that she is failed conservative care and physical therapy she does have some radicular component to her pain clinical findings consistent with that.  I would make further treatment recommendations depending on the MRI.    Meds & Orders: No orders of the defined types were placed in this encounter.  No orders of the defined types were placed in this encounter.   Follow-up: Return for MRI review after completion.   Procedures: No procedures performed  No notes on file   Clinical History: No specialty comments available.  She reports that  has never smoked. she has never used smokeless tobacco. No results for input(s): HGBA1C, LABURIC in the last 8760 hours.  Objective:  VS:  HT:    WT:   BMI:     BP:132/84  HR: bpm  TEMP: ( )  RESP:  Physical Exam  Constitutional: She is oriented to person, place, and time. She appears well-developed and well-nourished. No distress.  HENT:  Head: Normocephalic and atraumatic.  Nose: Nose normal.  Mouth/Throat: Oropharynx is clear and moist.  Eyes: Conjunctivae are normal. Pupils are equal, round, and reactive to light.  Neck: Normal range of motion. Neck supple. No tracheal deviation present.  Cardiovascular: Normal rate, regular rhythm and intact distal pulses.  Pulmonary/Chest: Effort normal. No respiratory distress. She has no wheezes.  Abdominal: Soft. She exhibits no distension. There is no guarding.  Musculoskeletal:  She ambulates without aid.  She has a fairly normal gait.  She does have pain with extension rotation of the lumbar spine.  She has no pain with hip rotation internal or external.  She does have tenderness on  both the greater trochanters but is not exquisite.  She has 5 out of 5 strength in all muscle groups of the lower extremities bilaterally proximal and distal.  She has no clonus.  She has an equivocally positive slump test on the left.  Neurological: She is alert and oriented to person, place, and time. She exhibits normal muscle tone. Coordination normal.  Skin: Skin is warm. No rash noted. No erythema.  Psychiatric: She has a normal mood and affect. Her behavior is normal.  Nursing note and vitals reviewed.   Ortho Exam Imaging: No results found.  Past Medical/Family/Surgical/Social History: Medications & Allergies reviewed per EMR There are no active problems to display for this patient.  Past Medical History:  Diagnosis Date  . Bronchitis   . Depression    History reviewed. No pertinent family history. Past Surgical History:  Procedure Laterality Date  .  HAND SURGERY Left 2008  . HAND SURGERY Right 2012  . TOTAL ABDOMINAL HYSTERECTOMY  1982   Social History   Occupational History  . Occupation: Retired  Tobacco Use  . Smoking status: Never Smoker  . Smokeless tobacco: Never Used  Substance and Sexual Activity  . Alcohol use: No    Frequency: Never  . Drug use: Not on file  . Sexual activity: Not on file

## 2017-12-25 NOTE — Telephone Encounter (Signed)
Tried to call patient to let her know that order is in, no auth required, waiting on imaging center to call her to schedule. Busy signal.

## 2017-12-28 ENCOUNTER — Ambulatory Visit
Admission: RE | Admit: 2017-12-28 | Discharge: 2017-12-28 | Disposition: A | Discharge status: Home / Self Care | Payer: Medicare Other | Source: Ambulatory Visit | Attending: Physical Medicine and Rehabilitation | Admitting: Physical Medicine and Rehabilitation

## 2017-12-28 DIAGNOSIS — M5126 Other intervertebral disc displacement, lumbar region: Secondary | ICD-10-CM | POA: Diagnosis not present

## 2017-12-28 DIAGNOSIS — M545 Low back pain: Principal | ICD-10-CM

## 2017-12-28 DIAGNOSIS — M48061 Spinal stenosis, lumbar region without neurogenic claudication: Secondary | ICD-10-CM | POA: Diagnosis not present

## 2017-12-28 DIAGNOSIS — G8929 Other chronic pain: Secondary | ICD-10-CM

## 2018-01-07 ENCOUNTER — Ambulatory Visit (INDEPENDENT_AMBULATORY_CARE_PROVIDER_SITE_OTHER): Payer: Medicare Other | Admitting: Physical Medicine and Rehabilitation

## 2018-01-12 ENCOUNTER — Encounter (INDEPENDENT_AMBULATORY_CARE_PROVIDER_SITE_OTHER): Payer: Medicare Other | Admitting: Physical Medicine and Rehabilitation

## 2018-01-13 ENCOUNTER — Encounter (INDEPENDENT_AMBULATORY_CARE_PROVIDER_SITE_OTHER): Payer: Self-pay | Admitting: Physical Medicine and Rehabilitation

## 2018-01-13 ENCOUNTER — Ambulatory Visit (INDEPENDENT_AMBULATORY_CARE_PROVIDER_SITE_OTHER): Payer: Medicare Other

## 2018-01-13 ENCOUNTER — Ambulatory Visit (INDEPENDENT_AMBULATORY_CARE_PROVIDER_SITE_OTHER): Payer: Medicare Other | Admitting: Physical Medicine and Rehabilitation

## 2018-01-13 VITALS — BP 121/76 | HR 91 | Temp 98.4°F

## 2018-01-13 DIAGNOSIS — M5416 Radiculopathy, lumbar region: Secondary | ICD-10-CM | POA: Diagnosis not present

## 2018-01-13 DIAGNOSIS — M48062 Spinal stenosis, lumbar region with neurogenic claudication: Secondary | ICD-10-CM

## 2018-01-13 MED ORDER — BETAMETHASONE SOD PHOS & ACET 6 (3-3) MG/ML IJ SUSP
12.0000 mg | Freq: Once | INTRAMUSCULAR | Status: AC
  Administered 2018-01-13: 12 mg

## 2018-01-13 NOTE — Progress Notes (Deleted)
Pt states shooting pain in lower back that radiates down both legs. Pt states no changes since last visit on 12/28/17. +Driver, -BT, -Dye Allergies.

## 2018-01-13 NOTE — Patient Instructions (Signed)

## 2018-01-14 NOTE — Procedures (Signed)
Lumbosacral Transforaminal Epidural Steroid Injection - Sub-Pedicular Approach with Fluoroscopic Guidance  Patient: Julie Duran      Date of Birth: [birth year] MRN: [id number] PCP: Carol Ada, MD      Visit Date: 01/13/2018   Universal Protocol:    Date/Time: 01/13/2018  Consent Given By: the patient  Position: PRONE  Additional Comments: Vital signs were monitored before and after the procedure. Patient was prepped and draped in the usual sterile fashion. The correct patient, procedure, and site was verified.   Injection Procedure Details:  Procedure Site One Meds Administered:  Meds ordered this encounter  Medications  . betamethasone acetate-betamethasone sodium phosphate (CELESTONE) injection 12 mg    Laterality: Bilateral  Location/Site:  L4-L5  Needle size: 22 G  Needle type: Spinal  Needle Placement: Transforaminal  Findings:    -Comments: Excellent flow of contrast along the nerve and into the epidural space.  Procedure Details: After squaring off the end-plates to get a true AP view, the C-arm was positioned so that an oblique view of the foramen as noted above was visualized. The target area is just inferior to the "nose of the scotty dog" or sub pedicular. The soft tissues overlying this structure were infiltrated with 2-3 ml. of 1% Lidocaine without Epinephrine.  The spinal needle was inserted toward the target using a "trajectory" view along the fluoroscope beam.  Under AP and lateral visualization, the needle was advanced so it did not puncture dura and was located close the 6 O'Clock position of the pedical in AP tracterory. Biplanar projections were used to confirm position. Aspiration was confirmed to be negative for CSF and/or blood. A 1-2 ml. volume of Isovue-250 was injected and flow of contrast was noted at each level. Radiographs were obtained for documentation purposes.   After attaining the desired flow of contrast documented above, a 0.5  to 1.0 ml test dose of 0.25% Marcaine was injected into each respective transforaminal space.  The patient was observed for 90 seconds post injection.  After no sensory deficits were reported, and normal lower extremity motor function was noted,   the above injectate was administered so that equal amounts of the injectate were placed at each foramen (level) into the transforaminal epidural space.   Additional Comments:  The patient tolerated the procedure well Dressing: Band-Aid    Post-procedure details: Patient was observed during the procedure. Post-procedure instructions were reviewed.  Patient left the clinic in stable condition.   Pertinent Imaging: MRI LUMBAR SPINE WITHOUT CONTRAST  TECHNIQUE: Multiplanar, multisequence MR imaging of the lumbar spine was performed. No intravenous contrast was administered.  COMPARISON:  None.  FINDINGS: Segmentation:  Standard  Alignment: Slight retrolisthesis at L2-3. Borderline L4-5 anterolisthesis. Prominent lumbar lordosis. Slight scoliosis.  Vertebrae:  No fracture, evidence of discitis, or bone lesion.  Conus medullaris and cauda equina: Conus extends to the T12-L1 level. Conus and cauda equina appear normal. Sacral Tarlov cysts with bony expansion on the left at S2.  Paraspinal and other soft tissues: Negative  Disc levels:  T12- L1: Unremarkable.  L1-L2: Mild disc bulging.  No impingement  L2-L3: Greatest level of degenerative disc narrowing with retrolisthesis. There is disc bulging with biforaminal protrusion. Mild bilateral foraminal narrowing. Mild, noncompressive spinal stenosis.  L3-L4: Mild disc narrowing and slight annulus bulging. No impingement  L4-L5: Facet arthropathy with joint distortion and borderline anterolisthesis. The disc is bulging and mildly narrowed. Moderate spinal stenosis. Right more than left L5 impingement in the subarticular recesses. Noncompressive bilateral foraminal  narrowing.  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 Degenerative facet spurring. No herniation or impingement  IMPRESSION: 1. L4-5 moderate spinal stenosis and right more than left L5 impingement in the subarticular recesses, primarily from posterior element hypertrophy. 2. Noncompressive degenerative changes described above. 3. Tarlov cysts with moderate bony expansion on the left at S2.   Electronically Signed   By: Monte Fantasia M.D.   On: 12/28/2017 18:35

## 2018-01-14 NOTE — Progress Notes (Signed)
Julie Duran - 72 y.o. female MRN [id number]  Date of birth: 20-Mar-1946  Office Visit Note: Visit Date: 01/13/2018 PCP: Carol Ada, MD Referred by: Carol Ada, MD  Subjective: Chief Complaint  Patient presents with  . Lower Back - Pain  . Right Leg - Pain  . Left Leg - Pain   HPI: Julie Duran is a 72 year old female who comes in today for bilateral L5 transforaminal epidural steroid injection.  Please see our prior evaluation and management note for further details and justification.    ROS Otherwise per HPI.  Assessment & Plan: Visit Diagnoses:  1. Lumbar radiculopathy   2. Spinal stenosis of lumbar region with neurogenic claudication     Plan: Findings:  Follow-up with me in 3 weeks.    Meds & Orders:  Meds ordered this encounter  Medications  . betamethasone acetate-betamethasone sodium phosphate (CELESTONE) injection 12 mg    Orders Placed This Encounter  Procedures  . XR C-ARM NO REPORT  . Epidural Steroid injection    Follow-up: Return in about 2 weeks (around 01/27/2018).   Procedures: No procedures performed  No notes on file   Clinical History: MRI LUMBAR SPINE WITHOUT CONTRAST  TECHNIQUE: Multiplanar, multisequence MR imaging of the lumbar spine was performed. No intravenous contrast was administered.  COMPARISON:  None.  FINDINGS: Segmentation:  Standard  Alignment: Slight retrolisthesis at L2-3. Borderline L4-5 anterolisthesis. Prominent lumbar lordosis. Slight scoliosis.  Vertebrae:  No fracture, evidence of discitis, or bone lesion.  Conus medullaris and cauda equina: Conus extends to the T12-L1 level. Conus and cauda equina appear normal. Sacral Tarlov cysts with bony expansion on the left at S2.  Paraspinal and other soft tissues: Negative  Disc levels:  T12- L1: Unremarkable.  L1-L2: Mild disc bulging.  No impingement  L2-L3: Greatest level of degenerative disc narrowing with retrolisthesis. There is  disc bulging with biforaminal protrusion. Mild bilateral foraminal narrowing. Mild, noncompressive spinal stenosis.  L3-L4: Mild disc narrowing and slight annulus bulging. No impingement  L4-L5: Facet arthropathy with joint distortion and borderline anterolisthesis. The disc is bulging and mildly narrowed. Moderate spinal stenosis. Right more than left L5 impingement in the subarticular recesses. Noncompressive bilateral foraminal narrowing.  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 Degenerative facet spurring. No herniation or impingement  IMPRESSION: 1. L4-5 moderate spinal stenosis and right more than left L5 impingement in the subarticular recesses, primarily from posterior element hypertrophy. 2. Noncompressive degenerative changes described above. 3. Tarlov cysts with moderate bony expansion on the left at S2.   Electronically Signed   By: Monte Fantasia M.D.   On: 12/28/2017 18:35  She reports that  has never smoked. she has never used smokeless tobacco. No results for input(s): HGBA1C, LABURIC in the last 8760 hours.  Objective:  VS:  HT:    WT:   BMI:     BP:121/76  HR:91bpm  TEMP:98.4 F (36.9 C)(Oral)  RESP:98 % Physical Exam  Musculoskeletal:  The patient ambulates without aid with a forward flexed spine with good distal strength.    Ortho Exam Imaging: Xr C-arm No Report  Result Date: 01/13/2018 Please see Notes or Procedures tab for imaging impression.   Past Medical/Family/Surgical/Social History: Medications & Allergies reviewed per EMR There are no active problems to display for this patient.  Past Medical History:  Diagnosis Date  . Bronchitis   . Depression    History reviewed. No pertinent family history. Past Surgical History:  Procedure Laterality Date  . HAND  SURGERY Left 2008  . HAND SURGERY Right 2012  . TOTAL ABDOMINAL HYSTERECTOMY  1982   Social History   Occupational History  . Occupation: Retired    Tobacco Use  . Smoking status: Never Smoker  . Smokeless tobacco: Never Used  Substance and Sexual Activity  . Alcohol use: No    Frequency: Never  . Drug use: Not on file  . Sexual activity: Not on file

## 2018-03-10 DIAGNOSIS — L821 Other seborrheic keratosis: Secondary | ICD-10-CM | POA: Diagnosis not present

## 2018-03-10 DIAGNOSIS — D692 Other nonthrombocytopenic purpura: Secondary | ICD-10-CM | POA: Diagnosis not present

## 2018-03-10 DIAGNOSIS — L570 Actinic keratosis: Secondary | ICD-10-CM | POA: Diagnosis not present

## 2018-03-10 DIAGNOSIS — D1801 Hemangioma of skin and subcutaneous tissue: Secondary | ICD-10-CM | POA: Diagnosis not present

## 2018-05-28 DIAGNOSIS — E7800 Pure hypercholesterolemia, unspecified: Secondary | ICD-10-CM | POA: Diagnosis not present

## 2018-05-28 DIAGNOSIS — F5101 Primary insomnia: Secondary | ICD-10-CM | POA: Diagnosis not present

## 2018-05-28 DIAGNOSIS — M797 Fibromyalgia: Secondary | ICD-10-CM | POA: Diagnosis not present

## 2018-05-28 DIAGNOSIS — F325 Major depressive disorder, single episode, in full remission: Secondary | ICD-10-CM | POA: Diagnosis not present

## 2018-06-02 ENCOUNTER — Telehealth (INDEPENDENT_AMBULATORY_CARE_PROVIDER_SITE_OTHER): Payer: Self-pay | Admitting: Physical Medicine and Rehabilitation

## 2018-06-08 NOTE — Telephone Encounter (Signed)
Scheduled for 7/29 at 1330 with driver.

## 2018-06-08 NOTE — Telephone Encounter (Signed)
Yes ok 

## 2018-06-29 ENCOUNTER — Ambulatory Visit (INDEPENDENT_AMBULATORY_CARE_PROVIDER_SITE_OTHER): Payer: Self-pay

## 2018-06-29 ENCOUNTER — Ambulatory Visit (INDEPENDENT_AMBULATORY_CARE_PROVIDER_SITE_OTHER): Payer: Medicare Other | Admitting: Physical Medicine and Rehabilitation

## 2018-06-29 ENCOUNTER — Encounter (INDEPENDENT_AMBULATORY_CARE_PROVIDER_SITE_OTHER): Payer: Self-pay | Admitting: Physical Medicine and Rehabilitation

## 2018-06-29 VITALS — BP 115/72 | HR 79

## 2018-06-29 DIAGNOSIS — M48062 Spinal stenosis, lumbar region with neurogenic claudication: Secondary | ICD-10-CM | POA: Diagnosis not present

## 2018-06-29 DIAGNOSIS — M5416 Radiculopathy, lumbar region: Secondary | ICD-10-CM | POA: Diagnosis not present

## 2018-06-29 MED ORDER — BETAMETHASONE SOD PHOS & ACET 6 (3-3) MG/ML IJ SUSP
12.0000 mg | Freq: Once | INTRAMUSCULAR | Status: AC
  Administered 2018-06-29: 12 mg

## 2018-06-29 NOTE — Progress Notes (Signed)
Julie Duran - 72 y.o. female MRN [id number]  Date of birth: [birth year]  Office Visit Note: Visit Date: 06/29/2018 PCP: Julie Ada, MD Referred by: Julie Ada, MD  Subjective: Chief Complaint  Patient presents with  . Lower Back - Pain  . Right Thigh - Pain  . Right Lower Leg - Pain  . Left Lower Leg - Pain   HPI: Julie Duran is a 72 year old female who comes in today for planned repeat bilateral L4 transforaminal epidural steroid injection for moderate multifactorial stenosis and right more than left hip and leg pain and low back pain.  Prior injection was very beneficial and this can be reviewed on her last note.   ROS Otherwise per HPI.  Assessment & Plan: Visit Diagnoses:  1. Lumbar radiculopathy   2. Spinal stenosis of lumbar region with neurogenic claudication     Plan: No additional findings.   Meds & Orders:  Meds ordered this encounter  Medications  . betamethasone acetate-betamethasone sodium phosphate (CELESTONE) injection 12 mg    Orders Placed This Encounter  Procedures  . XR C-ARM NO REPORT  . Epidural Steroid injection    Follow-up: Return if symptoms worsen or fail to improve.   Procedures: No procedures performed  Lumbosacral Transforaminal Epidural Steroid Injection - Sub-Pedicular Approach with Fluoroscopic Guidance  Patient: Julie Duran      Date of Birth: [birth year] MRN: [id number] PCP: Julie Ada, MD      Visit Date: 06/29/2018   Universal Protocol:    Date/Time: 06/29/2018  Consent Given By: the patient  Position: PRONE  Additional Comments: Vital signs were monitored before and after the procedure. Patient was prepped and draped in the usual sterile fashion. The correct patient, procedure, and site was verified.   Injection Procedure Details:  Procedure Site One Meds Administered:  Meds ordered this encounter  Medications  . betamethasone acetate-betamethasone sodium phosphate (CELESTONE) injection 12 mg     Laterality: Bilateral  Location/Site:  L4-L5  Needle size: 22 G  Needle type: Spinal  Needle Placement: Transforaminal  Findings:    -Comments: Excellent flow of contrast along the nerve and into the epidural space.  Procedure Details: After squaring off the end-plates to get a true AP view, the C-arm was positioned so that an oblique view of the foramen as noted above was visualized. The target area is just inferior to the "nose of the scotty dog" or sub pedicular. The soft tissues overlying this structure were infiltrated with 2-3 ml. of 1% Lidocaine without Epinephrine.  The spinal needle was inserted toward the target using a "trajectory" view along the fluoroscope beam.  Under AP and lateral visualization, the needle was advanced so it did not puncture dura and was located close the 6 O'Clock position of the pedical in AP tracterory. Biplanar projections were used to confirm position. Aspiration was confirmed to be negative for CSF and/or blood. A 1-2 ml. volume of Isovue-250 was injected and flow of contrast was noted at each level. Radiographs were obtained for documentation purposes.   After attaining the desired flow of contrast documented above, a 0.5 to 1.0 ml test dose of 0.25% Marcaine was injected into each respective transforaminal space.  The patient was observed for 90 seconds post injection.  After no sensory deficits were reported, and normal lower extremity motor function was noted,   the above injectate was administered so that equal amounts of the injectate were placed at each foramen (level) into the transforaminal epidural space.  Additional Comments:  The patient tolerated the procedure well Dressing: Band-Aid    Post-procedure details: Patient was observed during the procedure. Post-procedure instructions were reviewed.  Patient left the clinic in stable condition.    Clinical History: MRI LUMBAR SPINE WITHOUT CONTRAST  TECHNIQUE: Multiplanar,  multisequence MR imaging of the lumbar spine was performed. No intravenous contrast was administered.  COMPARISON:  None.  FINDINGS: Segmentation:  Standard  Alignment: Slight retrolisthesis at L2-3. Borderline L4-5 anterolisthesis. Prominent lumbar lordosis. Slight scoliosis.  Vertebrae:  No fracture, evidence of discitis, or bone lesion.  Conus medullaris and cauda equina: Conus extends to the T12-L1 level. Conus and cauda equina appear normal. Sacral Tarlov cysts with bony expansion on the left at S2.  Paraspinal and other soft tissues: Negative  Disc levels:  T12- L1: Unremarkable.  L1-L2: Mild disc bulging.  No impingement  L2-L3: Greatest level of degenerative disc narrowing with retrolisthesis. There is disc bulging with biforaminal protrusion. Mild bilateral foraminal narrowing. Mild, noncompressive spinal stenosis.  L3-L4: Mild disc narrowing and slight annulus bulging. No impingement  L4-L5: Facet arthropathy with joint distortion and borderline anterolisthesis. The disc is bulging and mildly narrowed. Moderate spinal stenosis. Right more than left L5 impingement in the subarticular recesses. Noncompressive bilateral foraminal narrowing.  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 Degenerative facet spurring. No herniation or impingement  IMPRESSION: 1. L4-5 moderate spinal stenosis and right more than left L5 impingement in the subarticular recesses, primarily from posterior element hypertrophy. 2. Noncompressive degenerative changes described above. 3. Tarlov cysts with moderate bony expansion on the left at S2.   Electronically Signed   By: Monte Fantasia M.D.   On: 12/28/2017 18:35   She reports that she has never smoked. She has never used smokeless tobacco. No results for input(s): HGBA1C, LABURIC in the last 8760 hours.  Objective:  VS:  HT:    WT:   BMI:     BP:115/72  HR:79bpm  TEMP: ( )  RESP:  Physical  Exam  Ortho Exam Imaging: Xr C-arm No Report  Result Date: 06/29/2018 Please see Notes tab for imaging impression.   Past Medical/Family/Surgical/Social History: Medications & Allergies reviewed per EMR, new medications updated. There are no active problems to display for this patient.  Past Medical History:  Diagnosis Date  . Bronchitis   . Depression    History reviewed. No pertinent family history. Past Surgical History:  Procedure Laterality Date  . HAND SURGERY Left 2008  . HAND SURGERY Right 2012  . TOTAL ABDOMINAL HYSTERECTOMY  1982   Social History   Occupational History  . Occupation: Retired  Tobacco Use  . Smoking status: Never Smoker  . Smokeless tobacco: Never Used  Substance and Sexual Activity  . Alcohol use: No    Frequency: Never  . Drug use: Not on file  . Sexual activity: Not on file

## 2018-06-29 NOTE — Patient Instructions (Signed)

## 2018-06-29 NOTE — Progress Notes (Signed)
 .  Numeric Pain Rating Scale and Functional Assessment Average Pain 5   In the last MONTH (on 0-10 scale) has pain interfered with the following?  1. General activity like being  able to carry out your everyday physical activities such as walking, climbing stairs, carrying groceries, or moving a chair?  Rating(4)   +Driver, -BT, -Dye Allergies.  

## 2018-06-30 NOTE — Procedures (Signed)
Lumbosacral Transforaminal Epidural Steroid Injection - Sub-Pedicular Approach with Fluoroscopic Guidance  Patient: Julie Duran      Date of Birth: 1946-02-16 MRN: [id number] PCP: Carol Ada, MD      Visit Date: 06/29/2018   Universal Protocol:    Date/Time: 06/29/2018  Consent Given By: the patient  Position: PRONE  Additional Comments: Vital signs were monitored before and after the procedure. Patient was prepped and draped in the usual sterile fashion. The correct patient, procedure, and site was verified.   Injection Procedure Details:  Procedure Site One Meds Administered:  Meds ordered this encounter  Medications  . betamethasone acetate-betamethasone sodium phosphate (CELESTONE) injection 12 mg    Laterality: Bilateral  Location/Site:  L4-L5  Needle size: 22 G  Needle type: Spinal  Needle Placement: Transforaminal  Findings:    -Comments: Excellent flow of contrast along the nerve and into the epidural space.  Procedure Details: After squaring off the end-plates to get a true AP view, the C-arm was positioned so that an oblique view of the foramen as noted above was visualized. The target area is just inferior to the "nose of the scotty dog" or sub pedicular. The soft tissues overlying this structure were infiltrated with 2-3 ml. of 1% Lidocaine without Epinephrine.  The spinal needle was inserted toward the target using a "trajectory" view along the fluoroscope beam.  Under AP and lateral visualization, the needle was advanced so it did not puncture dura and was located close the 6 O'Clock position of the pedical in AP tracterory. Biplanar projections were used to confirm position. Aspiration was confirmed to be negative for CSF and/or blood. A 1-2 ml. volume of Isovue-250 was injected and flow of contrast was noted at each level. Radiographs were obtained for documentation purposes.   After attaining the desired flow of contrast documented above, a 0.5  to 1.0 ml test dose of 0.25% Marcaine was injected into each respective transforaminal space.  The patient was observed for 90 seconds post injection.  After no sensory deficits were reported, and normal lower extremity motor function was noted,   the above injectate was administered so that equal amounts of the injectate were placed at each foramen (level) into the transforaminal epidural space.   Additional Comments:  The patient tolerated the procedure well Dressing: Band-Aid    Post-procedure details: Patient was observed during the procedure. Post-procedure instructions were reviewed.  Patient left the clinic in stable condition.

## 2018-09-23 DIAGNOSIS — H2513 Age-related nuclear cataract, bilateral: Secondary | ICD-10-CM | POA: Diagnosis not present

## 2018-09-23 DIAGNOSIS — H52221 Regular astigmatism, right eye: Secondary | ICD-10-CM | POA: Diagnosis not present

## 2018-09-23 DIAGNOSIS — H5213 Myopia, bilateral: Secondary | ICD-10-CM | POA: Diagnosis not present

## 2018-09-23 DIAGNOSIS — H524 Presbyopia: Secondary | ICD-10-CM | POA: Diagnosis not present

## 2018-11-12 DIAGNOSIS — Z1231 Encounter for screening mammogram for malignant neoplasm of breast: Secondary | ICD-10-CM | POA: Diagnosis not present

## 2018-11-12 DIAGNOSIS — Z01419 Encounter for gynecological examination (general) (routine) without abnormal findings: Secondary | ICD-10-CM | POA: Diagnosis not present

## 2018-11-12 DIAGNOSIS — Z780 Asymptomatic menopausal state: Secondary | ICD-10-CM | POA: Diagnosis not present

## 2018-11-12 DIAGNOSIS — Z79890 Hormone replacement therapy (postmenopausal): Secondary | ICD-10-CM | POA: Diagnosis not present

## 2019-02-02 DIAGNOSIS — E2839 Other primary ovarian failure: Secondary | ICD-10-CM | POA: Diagnosis not present

## 2019-02-02 DIAGNOSIS — Z0000 Encounter for general adult medical examination without abnormal findings: Secondary | ICD-10-CM | POA: Diagnosis not present

## 2019-02-02 DIAGNOSIS — E785 Hyperlipidemia, unspecified: Secondary | ICD-10-CM | POA: Diagnosis not present

## 2019-02-02 DIAGNOSIS — F5101 Primary insomnia: Secondary | ICD-10-CM | POA: Diagnosis not present

## 2019-02-02 DIAGNOSIS — F325 Major depressive disorder, single episode, in full remission: Secondary | ICD-10-CM | POA: Diagnosis not present

## 2019-02-02 DIAGNOSIS — Z1389 Encounter for screening for other disorder: Secondary | ICD-10-CM | POA: Diagnosis not present

## 2019-02-02 DIAGNOSIS — M797 Fibromyalgia: Secondary | ICD-10-CM | POA: Diagnosis not present

## 2019-02-09 ENCOUNTER — Other Ambulatory Visit: Payer: Self-pay | Admitting: Family Medicine

## 2019-02-09 DIAGNOSIS — E2839 Other primary ovarian failure: Secondary | ICD-10-CM

## 2019-05-05 DIAGNOSIS — K5792 Diverticulitis of intestine, part unspecified, without perforation or abscess without bleeding: Secondary | ICD-10-CM | POA: Diagnosis not present

## 2019-05-11 ENCOUNTER — Ambulatory Visit
Admission: RE | Admit: 2019-05-11 | Discharge: 2019-05-11 | Disposition: A | Discharge status: Home / Self Care | Payer: Medicare Other | Source: Ambulatory Visit | Attending: Family Medicine | Admitting: Family Medicine

## 2019-05-11 ENCOUNTER — Other Ambulatory Visit: Payer: Self-pay

## 2019-05-11 ENCOUNTER — Other Ambulatory Visit: Payer: Self-pay | Admitting: Family Medicine

## 2019-05-11 DIAGNOSIS — R1032 Left lower quadrant pain: Secondary | ICD-10-CM | POA: Diagnosis not present

## 2019-05-11 MED ORDER — IOPAMIDOL (ISOVUE-300) INJECTION 61%
100.0000 mL | Freq: Once | INTRAVENOUS | Status: AC | PRN
  Administered 2019-05-11: 100 mL via INTRAVENOUS

## 2019-07-27 DIAGNOSIS — Z20828 Contact with and (suspected) exposure to other viral communicable diseases: Secondary | ICD-10-CM | POA: Diagnosis not present

## 2019-07-30 DIAGNOSIS — H00021 Hordeolum internum right upper eyelid: Secondary | ICD-10-CM | POA: Diagnosis not present

## 2019-08-10 DIAGNOSIS — L821 Other seborrheic keratosis: Secondary | ICD-10-CM | POA: Diagnosis not present

## 2019-08-10 DIAGNOSIS — D2271 Melanocytic nevi of right lower limb, including hip: Secondary | ICD-10-CM | POA: Diagnosis not present

## 2019-08-10 DIAGNOSIS — L814 Other melanin hyperpigmentation: Secondary | ICD-10-CM | POA: Diagnosis not present

## 2019-08-12 DIAGNOSIS — M797 Fibromyalgia: Secondary | ICD-10-CM | POA: Diagnosis not present

## 2019-08-12 DIAGNOSIS — F5101 Primary insomnia: Secondary | ICD-10-CM | POA: Diagnosis not present

## 2019-08-12 DIAGNOSIS — F325 Major depressive disorder, single episode, in full remission: Secondary | ICD-10-CM | POA: Diagnosis not present

## 2019-08-16 DIAGNOSIS — Z23 Encounter for immunization: Secondary | ICD-10-CM | POA: Diagnosis not present

## 2019-08-18 DIAGNOSIS — H5213 Myopia, bilateral: Secondary | ICD-10-CM | POA: Diagnosis not present

## 2019-08-18 DIAGNOSIS — H524 Presbyopia: Secondary | ICD-10-CM | POA: Diagnosis not present

## 2019-08-18 DIAGNOSIS — H2513 Age-related nuclear cataract, bilateral: Secondary | ICD-10-CM | POA: Diagnosis not present

## 2019-08-18 DIAGNOSIS — H52221 Regular astigmatism, right eye: Secondary | ICD-10-CM | POA: Diagnosis not present

## 2019-08-26 DIAGNOSIS — F329 Major depressive disorder, single episode, unspecified: Secondary | ICD-10-CM | POA: Diagnosis not present

## 2019-08-26 DIAGNOSIS — E7800 Pure hypercholesterolemia, unspecified: Secondary | ICD-10-CM | POA: Diagnosis not present

## 2019-08-26 DIAGNOSIS — F325 Major depressive disorder, single episode, in full remission: Secondary | ICD-10-CM | POA: Diagnosis not present

## 2019-08-26 DIAGNOSIS — E785 Hyperlipidemia, unspecified: Secondary | ICD-10-CM | POA: Diagnosis not present

## 2019-08-31 DIAGNOSIS — H2513 Age-related nuclear cataract, bilateral: Secondary | ICD-10-CM | POA: Diagnosis not present

## 2019-08-31 DIAGNOSIS — H25043 Posterior subcapsular polar age-related cataract, bilateral: Secondary | ICD-10-CM | POA: Diagnosis not present

## 2019-08-31 DIAGNOSIS — H25013 Cortical age-related cataract, bilateral: Secondary | ICD-10-CM | POA: Diagnosis not present

## 2019-08-31 DIAGNOSIS — H18413 Arcus senilis, bilateral: Secondary | ICD-10-CM | POA: Diagnosis not present

## 2019-08-31 DIAGNOSIS — H2511 Age-related nuclear cataract, right eye: Secondary | ICD-10-CM | POA: Diagnosis not present

## 2019-09-10 DIAGNOSIS — Z20828 Contact with and (suspected) exposure to other viral communicable diseases: Secondary | ICD-10-CM | POA: Diagnosis not present

## 2019-09-10 DIAGNOSIS — Z6822 Body mass index (BMI) 22.0-22.9, adult: Secondary | ICD-10-CM | POA: Diagnosis not present

## 2019-09-20 DIAGNOSIS — Z9841 Cataract extraction status, right eye: Secondary | ICD-10-CM | POA: Diagnosis not present

## 2019-09-20 DIAGNOSIS — Z961 Presence of intraocular lens: Secondary | ICD-10-CM | POA: Diagnosis not present

## 2019-09-20 DIAGNOSIS — H2511 Age-related nuclear cataract, right eye: Secondary | ICD-10-CM | POA: Diagnosis not present

## 2019-09-21 DIAGNOSIS — H25042 Posterior subcapsular polar age-related cataract, left eye: Secondary | ICD-10-CM | POA: Diagnosis not present

## 2019-09-21 DIAGNOSIS — H25012 Cortical age-related cataract, left eye: Secondary | ICD-10-CM | POA: Diagnosis not present

## 2019-09-21 DIAGNOSIS — H2512 Age-related nuclear cataract, left eye: Secondary | ICD-10-CM | POA: Diagnosis not present

## 2019-10-11 DIAGNOSIS — H52222 Regular astigmatism, left eye: Secondary | ICD-10-CM | POA: Diagnosis not present

## 2019-10-11 DIAGNOSIS — Z961 Presence of intraocular lens: Secondary | ICD-10-CM | POA: Diagnosis not present

## 2019-10-11 DIAGNOSIS — H2512 Age-related nuclear cataract, left eye: Secondary | ICD-10-CM | POA: Diagnosis not present

## 2019-10-11 DIAGNOSIS — H5211 Myopia, right eye: Secondary | ICD-10-CM | POA: Diagnosis not present

## 2019-11-16 DIAGNOSIS — Z01419 Encounter for gynecological examination (general) (routine) without abnormal findings: Secondary | ICD-10-CM | POA: Diagnosis not present

## 2019-11-16 DIAGNOSIS — Z1231 Encounter for screening mammogram for malignant neoplasm of breast: Secondary | ICD-10-CM | POA: Diagnosis not present

## 2019-11-16 DIAGNOSIS — Z79890 Hormone replacement therapy (postmenopausal): Secondary | ICD-10-CM | POA: Diagnosis not present

## 2019-11-16 DIAGNOSIS — Z780 Asymptomatic menopausal state: Secondary | ICD-10-CM | POA: Diagnosis not present

## 2020-01-27 DIAGNOSIS — Z23 Encounter for immunization: Secondary | ICD-10-CM | POA: Diagnosis not present

## 2020-02-09 DIAGNOSIS — Z1389 Encounter for screening for other disorder: Secondary | ICD-10-CM | POA: Diagnosis not present

## 2020-02-09 DIAGNOSIS — Z0000 Encounter for general adult medical examination without abnormal findings: Secondary | ICD-10-CM | POA: Diagnosis not present

## 2020-02-09 DIAGNOSIS — E785 Hyperlipidemia, unspecified: Secondary | ICD-10-CM | POA: Diagnosis not present

## 2020-02-09 DIAGNOSIS — F5101 Primary insomnia: Secondary | ICD-10-CM | POA: Diagnosis not present

## 2020-02-09 DIAGNOSIS — E2839 Other primary ovarian failure: Secondary | ICD-10-CM | POA: Diagnosis not present

## 2020-02-09 DIAGNOSIS — G8929 Other chronic pain: Secondary | ICD-10-CM | POA: Diagnosis not present

## 2020-02-09 DIAGNOSIS — M797 Fibromyalgia: Secondary | ICD-10-CM | POA: Diagnosis not present

## 2020-02-10 ENCOUNTER — Other Ambulatory Visit: Payer: Self-pay | Admitting: Gynecology

## 2020-02-10 DIAGNOSIS — Z13820 Encounter for screening for osteoporosis: Secondary | ICD-10-CM

## 2020-02-24 DIAGNOSIS — Z23 Encounter for immunization: Secondary | ICD-10-CM | POA: Diagnosis not present

## 2020-03-24 DIAGNOSIS — E785 Hyperlipidemia, unspecified: Secondary | ICD-10-CM | POA: Diagnosis not present

## 2020-04-04 DIAGNOSIS — M25561 Pain in right knee: Secondary | ICD-10-CM | POA: Diagnosis not present

## 2020-04-18 ENCOUNTER — Ambulatory Visit
Admission: RE | Admit: 2020-04-18 | Discharge: 2020-04-18 | Disposition: A | Discharge status: Home / Self Care | Payer: Medicare Other | Source: Ambulatory Visit | Attending: Gynecology | Admitting: Gynecology

## 2020-04-18 ENCOUNTER — Other Ambulatory Visit: Payer: Self-pay

## 2020-04-18 DIAGNOSIS — M85852 Other specified disorders of bone density and structure, left thigh: Secondary | ICD-10-CM | POA: Diagnosis not present

## 2020-04-18 DIAGNOSIS — Z780 Asymptomatic menopausal state: Secondary | ICD-10-CM | POA: Diagnosis not present

## 2020-04-18 DIAGNOSIS — Z13820 Encounter for screening for osteoporosis: Secondary | ICD-10-CM

## 2020-06-06 DIAGNOSIS — M25461 Effusion, right knee: Secondary | ICD-10-CM | POA: Diagnosis not present

## 2020-08-09 DIAGNOSIS — I788 Other diseases of capillaries: Secondary | ICD-10-CM | POA: Diagnosis not present

## 2020-08-09 DIAGNOSIS — L812 Freckles: Secondary | ICD-10-CM | POA: Diagnosis not present

## 2020-08-09 DIAGNOSIS — D1801 Hemangioma of skin and subcutaneous tissue: Secondary | ICD-10-CM | POA: Diagnosis not present

## 2020-08-09 DIAGNOSIS — L821 Other seborrheic keratosis: Secondary | ICD-10-CM | POA: Diagnosis not present

## 2020-08-18 DIAGNOSIS — H52222 Regular astigmatism, left eye: Secondary | ICD-10-CM | POA: Diagnosis not present

## 2020-08-18 DIAGNOSIS — H5213 Myopia, bilateral: Secondary | ICD-10-CM | POA: Diagnosis not present

## 2020-08-18 DIAGNOSIS — H524 Presbyopia: Secondary | ICD-10-CM | POA: Diagnosis not present

## 2020-08-18 DIAGNOSIS — H26493 Other secondary cataract, bilateral: Secondary | ICD-10-CM | POA: Diagnosis not present

## 2020-08-24 DIAGNOSIS — R29898 Other symptoms and signs involving the musculoskeletal system: Secondary | ICD-10-CM | POA: Diagnosis not present

## 2020-08-24 DIAGNOSIS — E7800 Pure hypercholesterolemia, unspecified: Secondary | ICD-10-CM | POA: Diagnosis not present

## 2020-08-24 DIAGNOSIS — F5101 Primary insomnia: Secondary | ICD-10-CM | POA: Diagnosis not present

## 2020-08-24 DIAGNOSIS — M797 Fibromyalgia: Secondary | ICD-10-CM | POA: Diagnosis not present

## 2020-08-24 DIAGNOSIS — Z23 Encounter for immunization: Secondary | ICD-10-CM | POA: Diagnosis not present

## 2020-08-29 DIAGNOSIS — M1711 Unilateral primary osteoarthritis, right knee: Secondary | ICD-10-CM | POA: Diagnosis not present

## 2020-08-29 DIAGNOSIS — E7800 Pure hypercholesterolemia, unspecified: Secondary | ICD-10-CM | POA: Diagnosis not present

## 2020-08-29 DIAGNOSIS — F325 Major depressive disorder, single episode, in full remission: Secondary | ICD-10-CM | POA: Diagnosis not present

## 2020-08-29 DIAGNOSIS — E785 Hyperlipidemia, unspecified: Secondary | ICD-10-CM | POA: Diagnosis not present

## 2020-08-29 DIAGNOSIS — F329 Major depressive disorder, single episode, unspecified: Secondary | ICD-10-CM | POA: Diagnosis not present

## 2020-08-31 DIAGNOSIS — M1711 Unilateral primary osteoarthritis, right knee: Secondary | ICD-10-CM | POA: Diagnosis not present

## 2020-10-02 DIAGNOSIS — M170 Bilateral primary osteoarthritis of knee: Secondary | ICD-10-CM | POA: Diagnosis not present

## 2020-11-15 DIAGNOSIS — N952 Postmenopausal atrophic vaginitis: Secondary | ICD-10-CM | POA: Diagnosis not present

## 2020-11-15 DIAGNOSIS — Z79890 Hormone replacement therapy (postmenopausal): Secondary | ICD-10-CM | POA: Diagnosis not present

## 2020-11-15 DIAGNOSIS — Z23 Encounter for immunization: Secondary | ICD-10-CM | POA: Diagnosis not present

## 2020-11-15 DIAGNOSIS — Z01419 Encounter for gynecological examination (general) (routine) without abnormal findings: Secondary | ICD-10-CM | POA: Diagnosis not present

## 2020-12-12 DIAGNOSIS — M791 Myalgia, unspecified site: Secondary | ICD-10-CM | POA: Diagnosis not present

## 2020-12-12 DIAGNOSIS — E785 Hyperlipidemia, unspecified: Secondary | ICD-10-CM | POA: Diagnosis not present

## 2021-01-03 DIAGNOSIS — Z1231 Encounter for screening mammogram for malignant neoplasm of breast: Secondary | ICD-10-CM | POA: Diagnosis not present

## 2021-02-12 DIAGNOSIS — E785 Hyperlipidemia, unspecified: Secondary | ICD-10-CM | POA: Diagnosis not present

## 2021-02-12 DIAGNOSIS — M797 Fibromyalgia: Secondary | ICD-10-CM | POA: Diagnosis not present

## 2021-02-12 DIAGNOSIS — M170 Bilateral primary osteoarthritis of knee: Secondary | ICD-10-CM | POA: Diagnosis not present

## 2021-02-12 DIAGNOSIS — F325 Major depressive disorder, single episode, in full remission: Secondary | ICD-10-CM | POA: Diagnosis not present

## 2021-02-12 DIAGNOSIS — Z0000 Encounter for general adult medical examination without abnormal findings: Secondary | ICD-10-CM | POA: Diagnosis not present

## 2021-02-12 DIAGNOSIS — Z1159 Encounter for screening for other viral diseases: Secondary | ICD-10-CM | POA: Diagnosis not present

## 2021-02-12 DIAGNOSIS — Z1389 Encounter for screening for other disorder: Secondary | ICD-10-CM | POA: Diagnosis not present

## 2021-04-03 DIAGNOSIS — L570 Actinic keratosis: Secondary | ICD-10-CM | POA: Diagnosis not present

## 2021-04-05 ENCOUNTER — Telehealth: Payer: Self-pay | Admitting: Infectious Diseases

## 2021-04-05 DIAGNOSIS — U071 COVID-19: Secondary | ICD-10-CM

## 2021-04-05 NOTE — Telephone Encounter (Addendum)
Called to discuss with patient about COVID-19 symptoms and the use of one of the available treatments for those with mild to moderate Covid symptoms and at a high risk of hospitalization.  Pt appears to qualify for outpatient treatment due to co-morbid conditions and/or a member of an at-risk group in accordance with the FDA Emergency Use Authorization.    Symptom onset: Monday 5/02 Vaccinated: unknown Booster?  Immunocompromised? unknown Qualifiers: unknown NIH Criteria: unknown   Unable to reach pt - LVM and sent mychart    Julie Duran    Outpatient Oral COVID Treatment Note  I connected with Julie Duran on 04/06/2021/8:18 AM by telephone and verified that I am speaking with the correct person using two identifiers.  I discussed the limitations, risks, security, and privacy concerns of performing an evaluation and management service by telephone and the availability of in person appointments. I also discussed with the patient that there may be a patient responsible charge related to this service. The patient expressed understanding and agreed to proceed.  Patient location: [place] residence  Provider location:  residence   Diagnosis: COVID-19 infection  Purpose of visit: Discussion of potential use of Molnupiravir or Paxlovid, a new treatment for mild to moderate COVID-19 viral infection in non-hospitalized patients.   Subjective: Patient is a 75 y.o. female who has been diagnosed with COVID 19 viral infection.  Their symptoms began on 5/02 with URI/fever and body aches.    Past Medical History:  Diagnosis Date  . Bronchitis   . Depression     Allergies  Allergen Reactions  . Oxycodone-Acetaminophen      Current Outpatient Medications:  .  Bioflavonoid Products (VITAMIN C PLUS) 1000 MG TABS, Vitamin C 1,000 mg tablet  Take by oral route., Disp: , Rfl:  .  calcium carbonate (OS-CAL) 600 MG TABS tablet, calcium carbonate, Disp: , Rfl:  .  clonazePAM (KLONOPIN)  0.5 MG tablet, clonazepam 0.5 mg tablet, Disp: , Rfl:  .  clonazePAM (KLONOPIN) 0.5 MG tablet, , Disp: , Rfl:  .  cyclobenzaprine (FLEXERIL) 10 MG tablet, cyclobenzaprine 10 mg tablet, Disp: , Rfl:  .  DULoxetine (CYMBALTA) 60 MG capsule, , Disp: , Rfl:  .  ESTRACE VAGINAL 0.1 MG/GM vaginal cream, , Disp: , Rfl:  .  estradiol (ESTRACE VAGINAL) 0.1 MG/GM vaginal cream, Estrace 0.01% (0.1 mg/gram) vaginal cream  1/2 gram intravaginally 2 X week, Disp: , Rfl:  .  gabapentin (NEURONTIN) 100 MG capsule, gabapentin 100 mg capsule, Disp: , Rfl:  .  Magnesium 500 MG CAPS, magnesium, Disp: , Rfl:  .  Melatonin 1 MG CAPS, Take 2 mg by mouth as needed., Disp: , Rfl:  .  Multiple Vitamins-Minerals (MULTIVITAMIN ADULT EXTRA C PO), Multivitamin 50 Plus, Disp: , Rfl:  .  Polyethylene Glycol 3350 (MIRALAX PO), Miralax, Disp: , Rfl:  .  Probiotic Product (PROBIOTIC-10 PO), Probiotic, Disp: , Rfl:  .  traMADol (ULTRAM) 50 MG tablet, Take 50 mg by mouth 2 (two) times daily., Disp: , Rfl: 0  Objective: Patient appears/sounds mildly ill, uncomfortable.  They are in no apparent distress.  Breathing is non labored.  Mood and behavior are normal.  Laboratory Data:  No results found for this or any previous visit (from the past 2160 hour(s)).   Assessment: 74 y.o. female with mild/moderate COVID 19 viral infection diagnosed on 5/04 at high risk for progression to severe COVID 19.  Plan:  This patient is a 75 y.o. female that meets the following criteria  for Emergency Use Authorization of: Paxlovid 1. Age >12 yr AND > 40 kg 2. SARS-COV-2 positive test 3. Symptom onset < 5 days 4. Mild-to-moderate COVID disease with high risk for severe progression to hospitalization or death  I have spoken and communicated the following to the patient or parent/caregiver regarding: 1. Paxlovid is an unapproved drug that is authorized for use under an Emergency Use Authorization.  2. There are no adequate, approved, available  products for the treatment of COVID-19 in adults who have mild-to-moderate COVID-19 and are at high risk for progressing to severe COVID-19, including hospitalization or death. 3. Other therapeutics are currently authorized. For additional information on all products authorized for treatment or prevention of COVID-19, please see [employer].  4. There are benefits and risks of taking this treatment as outlined in the "Fact Sheet for Patients and Caregivers."  5. "Fact Sheet for Patients and Caregivers" was reviewed with patient. A hard copy will be provided to patient from pharmacy prior to the patient receiving treatment. 6. Patients should continue to self-isolate and use infection control measures (e.g., wear mask, isolate, social distance, avoid sharing personal items, clean and disinfect "high touch" surfaces, and frequent handwashing) according to CDC guidelines.  7. The patient or parent/caregiver has the option to accept or refuse treatment. 8. Patient medication history was reviewed for potential drug interactions:Interaction with home meds: patient will 1/2 the clonapin and tramadol due to increased effect from paxlovid and monitor for increased effect to these medications.  9. Patient's GFR was calculated to be 57 (per [place] Records from 01/2021), and they were therefore prescribed Reduced dose (GFR 30-60) - nirmatrelvir 150mg  tab (1 tablet) by mouth twice daily AND ritonavir 100mg  tab (1 tablet) by mouth twice daily   After reviewing above information with the patient, the patient agrees to receive Paxlovid.  Follow up instructions:    . Take prescription BID x 5 days as directed . Reach out to pharmacist for counseling on medication if desired . For concerns regarding further COVID symptoms please follow up with your PCP or urgent care . For urgent or life-threatening issues,  seek care at your local emergency department  The patient was provided an opportunity to ask questions, and all were answered. The patient agreed with the plan and demonstrated an understanding of the instructions.   Script sent to patient's preferred [facility] pharmacy - listed on DHHS site as a Paxlovid carrier.   The patient was advised to call their PCP or seek an in-person evaluation if the symptoms worsen or if the condition fails to improve as anticipated.   I provided 13 minutes of non face-to-face telephone visit time during this encounter, and > 50% was spent counseling as documented under my assessment & plan.  Julie Madeira, NP 04/06/2021 /8:18 AM

## 2021-04-06 MED ORDER — NIRMATRELVIR/RITONAVIR (PAXLOVID)TABLET: 2.0000 | ORAL_TABLET | Freq: Two times a day (BID) | ORAL | 0 refills | Status: AC

## 2021-04-06 NOTE — Addendum Note (Signed)
Addended by: [place] Callas on: 04/06/2021 08:33 AM   Modules accepted: Orders

## 2021-07-09 ENCOUNTER — Emergency Department (HOSPITAL_BASED_OUTPATIENT_CLINIC_OR_DEPARTMENT_OTHER)
Admission: EM | Admit: 2021-07-09 | Discharge: 2021-07-09 | Disposition: A | Discharge status: Home / Self Care | Payer: Medicare Other | Attending: Emergency Medicine | Admitting: Emergency Medicine

## 2021-07-09 ENCOUNTER — Other Ambulatory Visit: Payer: Self-pay

## 2021-07-09 ENCOUNTER — Encounter (HOSPITAL_BASED_OUTPATIENT_CLINIC_OR_DEPARTMENT_OTHER): Payer: Self-pay | Admitting: Emergency Medicine

## 2021-07-09 DIAGNOSIS — M79641 Pain in right hand: Secondary | ICD-10-CM | POA: Diagnosis not present

## 2021-07-09 HISTORY — DX: Fibromyalgia: M79.7

## 2021-07-09 MED ORDER — DIPHENHYDRAMINE HCL 25 MG PO CAPS
25.0000 mg | ORAL_CAPSULE | Freq: Once | ORAL | Status: AC
  Administered 2021-07-09: 25 mg via ORAL
  Filled 2021-07-09: qty 1

## 2021-07-09 MED ORDER — CLINDAMYCIN HCL 300 MG PO CAPS: 300.0000 mg | ORAL_CAPSULE | Freq: Three times a day (TID) | ORAL | 0 refills | Status: AC

## 2021-07-09 NOTE — Discharge Instructions (Addendum)
Overall suspect that this is likely an allergic reaction.  Take Benadryl to help with itchiness and swelling.  Take antibiotic in case this is infectious.  Follow-up with your primary care doctor.  Return sooner if swelling worsens rapidly over the next 24 hours.

## 2021-07-09 NOTE — ED Triage Notes (Signed)
She scratched her R thumb on Sunday from a garbage can. Today she woke up with redness and swelling to thumb and index finger and pain. She had some left over keflex at home that she took yesterday.

## 2021-07-09 NOTE — ED Provider Notes (Signed)
[facility] EMERGENCY DEPT Provider Note   CSN: [id number] Arrival date & time: 07/09/21  1008     History Chief Complaint  Patient presents with   Finger Injury    Julie Duran is a 75 y.o. female.  Swelling to her right thumb and right index finger for 2 days.  Thinks that she might of gotten stung by something while moving garbage the other day.  Might of gotten pricked by something on the garbage can as well.  Denies any fevers or chills.  Has good movement of the fingers.  The history is provided by the patient.  Hand Pain This is a new problem. The current episode started 2 days ago. The problem occurs constantly. Nothing aggravates the symptoms. Nothing relieves the symptoms. She has tried nothing for the symptoms. The treatment provided no relief.      Past Medical History:  Diagnosis Date   Bronchitis    Depression    Fibromyalgia     There are no problems to display for this patient.   Past Surgical History:  Procedure Laterality Date   HAND SURGERY Left 2008   HAND SURGERY Right 2012   TOTAL ABDOMINAL HYSTERECTOMY  1982     OB History   No obstetric history on file.     No family history on file.  Social History   Tobacco Use   Smoking status: Never   Smokeless tobacco: Never  Vaping Use   Vaping Use: Never used  Substance Use Topics   Alcohol use: No    Home Medications Prior to Admission medications   Medication Sig Start Date End Date Taking? Authorizing Provider  clindamycin (CLEOCIN) 300 MG capsule Take 1 capsule (300 mg total) by mouth 3 (three) times daily for 10 days. 07/09/21 07/19/21 Yes Shajuana Mclucas, DO  Bioflavonoid Products (VITAMIN C PLUS) 1000 MG TABS Vitamin C 1,000 mg tablet  Take by oral route.    [provider]  calcium carbonate (OS-CAL) 600 MG TABS tablet calcium carbonate    [provider]  clonazePAM (KLONOPIN) 0.5 MG tablet clonazepam 0.5 mg tablet    [provider]  clonazePAM (KLONOPIN) 0.5 MG tablet  09/10/17   [provider]  cyclobenzaprine (FLEXERIL) 10 MG tablet cyclobenzaprine 10 mg tablet    [provider]  DULoxetine (CYMBALTA) 60 MG capsule  12/28/17   [provider]  ESTRACE VAGINAL 0.1 MG/GM vaginal cream  10/30/17   [provider]  estradiol (ESTRACE VAGINAL) 0.1 MG/GM vaginal cream Estrace 0.01% (0.1 mg/gram) vaginal cream  1/2 gram intravaginally 2 X week    [provider]  gabapentin (NEURONTIN) 100 MG capsule gabapentin 100 mg capsule    [provider]  Magnesium 500 MG CAPS magnesium    [provider]  Melatonin 1 MG CAPS Take 2 mg by mouth as needed.    [provider]  Multiple Vitamins-Minerals (MULTIVITAMIN ADULT EXTRA C PO) Multivitamin 50 Plus    [provider]  Polyethylene Glycol 3350 (MIRALAX PO) Miralax    [provider]  Probiotic Product (PROBIOTIC-10 PO) Probiotic    [provider]  traMADol (ULTRAM) 50 MG tablet Take 50 mg by mouth 2 (two) times daily. 12/28/17   [provider]    Allergies    Oxycodone-acetaminophen  Review of Systems   Review of Systems  Constitutional:  Negative for fever.  Musculoskeletal:  Negative for arthralgias and joint swelling.  Skin:  Positive for color  change. Negative for pallor, rash and wound.  Neurological:  Negative for weakness and numbness.   Physical Exam Updated Vital Signs BP 128/77 (BP Location: Left Arm)   Pulse 78   Temp 99.4 F (37.4 C) (Oral)   Resp 18   Ht '5\' 7"'$  (1.702 m)   Wt 64 kg   SpO2 98%   BMI 22.08 kg/m   Physical Exam Constitutional:      General: She is not in acute distress.    Appearance: She is not ill-appearing.  Cardiovascular:     Pulses: Normal pulses.  Musculoskeletal:        General: No tenderness or deformity. Normal range of motion.  Skin:    Capillary Refill: Capillary refill takes less than 2 seconds.      Findings: Erythema present.     Comments: There is redness and some mild swelling to the thumb and the right index finger but there is good range of motion of these fingers without any pain  Neurological:     General: No focal deficit present.     Mental Status: She is alert.     Sensory: No sensory deficit.     Motor: No weakness.    ED Results / Procedures / Treatments   Labs (all labs ordered are listed, but only abnormal results are displayed) Labs Reviewed - No data to display  EKG None  Radiology No results found.  Procedures Procedures   Medications Ordered in ED Medications  diphenhydrAMINE (BENADRYL) capsule 25 mg (25 mg Oral Given 07/09/21 1046)    ED Course  I have reviewed the triage vital signs and the nursing notes.  Pertinent labs & imaging results that were available during my care of the patient were reviewed by me and considered in my medical decision making (see chart for details).    MDM Rules/Calculators/A&P                           Clint Bolder is here with redness, swelling, pain to the right thumb and index finger.  Normal vitals.  No fever.  Thinks that she was stung or pricked by something while moving a garbage can 2 days ago.  Redness and swelling slightly worse over the last 2 days.  Is able to flex and extend without much discomfort.  No concern for flexor tenosynovitis.  Overall suspect that this could be an allergic reaction from bee sting.  She states that fingers are itchy.  Has not taken any Benadryl.  Overall suspect that this is an allergic process.  Recommend Benadryl but on the off chance that this could be cellulitis we will start her on antibiotics.  Understands return precautions and recommend wound recheck with primary care doctor in 2 days.  Neurovascular neuromuscular intact otherwise.  Discharged in good condition.  This chart was dictated using voice recognition software.  Despite best efforts to proofread,  errors can  occur which can change the documentation meaning.   Final Clinical Impression(s) / ED Diagnoses Final diagnoses:  Hand pain, right    Rx / DC Orders ED Discharge Orders          Ordered    clindamycin (CLEOCIN) 300 MG capsule  3 times daily        07/09/21 [place], [place], DO 07/09/21 1048

## 2021-07-11 DIAGNOSIS — T63451D Toxic effect of venom of hornets, accidental (unintentional), subsequent encounter: Secondary | ICD-10-CM | POA: Diagnosis not present

## 2021-07-11 DIAGNOSIS — L03012 Cellulitis of left finger: Secondary | ICD-10-CM | POA: Diagnosis not present

## 2021-08-16 DIAGNOSIS — F325 Major depressive disorder, single episode, in full remission: Secondary | ICD-10-CM | POA: Diagnosis not present

## 2021-08-16 DIAGNOSIS — M797 Fibromyalgia: Secondary | ICD-10-CM | POA: Diagnosis not present

## 2021-08-16 DIAGNOSIS — E785 Hyperlipidemia, unspecified: Secondary | ICD-10-CM | POA: Diagnosis not present

## 2021-08-20 DIAGNOSIS — L821 Other seborrheic keratosis: Secondary | ICD-10-CM | POA: Diagnosis not present

## 2021-08-20 DIAGNOSIS — I788 Other diseases of capillaries: Secondary | ICD-10-CM | POA: Diagnosis not present

## 2021-08-20 DIAGNOSIS — L738 Other specified follicular disorders: Secondary | ICD-10-CM | POA: Diagnosis not present

## 2021-08-20 DIAGNOSIS — D1801 Hemangioma of skin and subcutaneous tissue: Secondary | ICD-10-CM | POA: Diagnosis not present

## 2021-08-20 DIAGNOSIS — L814 Other melanin hyperpigmentation: Secondary | ICD-10-CM | POA: Diagnosis not present

## 2021-09-04 DIAGNOSIS — K5730 Diverticulosis of large intestine without perforation or abscess without bleeding: Secondary | ICD-10-CM | POA: Diagnosis not present

## 2021-09-04 DIAGNOSIS — K5900 Constipation, unspecified: Secondary | ICD-10-CM | POA: Diagnosis not present

## 2021-09-04 DIAGNOSIS — Z800 Family history of malignant neoplasm of digestive organs: Secondary | ICD-10-CM | POA: Diagnosis not present

## 2021-09-04 DIAGNOSIS — Z1211 Encounter for screening for malignant neoplasm of colon: Secondary | ICD-10-CM | POA: Diagnosis not present

## 2021-09-28 DIAGNOSIS — H52223 Regular astigmatism, bilateral: Secondary | ICD-10-CM | POA: Diagnosis not present

## 2021-09-28 DIAGNOSIS — H5211 Myopia, right eye: Secondary | ICD-10-CM | POA: Diagnosis not present

## 2021-09-28 DIAGNOSIS — H5202 Hypermetropia, left eye: Secondary | ICD-10-CM | POA: Diagnosis not present

## 2021-09-28 DIAGNOSIS — H26493 Other secondary cataract, bilateral: Secondary | ICD-10-CM | POA: Diagnosis not present

## 2021-09-28 DIAGNOSIS — H524 Presbyopia: Secondary | ICD-10-CM | POA: Diagnosis not present

## 2021-10-10 DIAGNOSIS — Z1211 Encounter for screening for malignant neoplasm of colon: Secondary | ICD-10-CM | POA: Diagnosis not present

## 2021-10-10 DIAGNOSIS — Z800 Family history of malignant neoplasm of digestive organs: Secondary | ICD-10-CM | POA: Diagnosis not present

## 2021-10-10 DIAGNOSIS — K5730 Diverticulosis of large intestine without perforation or abscess without bleeding: Secondary | ICD-10-CM | POA: Diagnosis not present

## 2021-12-26 DIAGNOSIS — D485 Neoplasm of uncertain behavior of skin: Secondary | ICD-10-CM | POA: Diagnosis not present

## 2021-12-26 DIAGNOSIS — C44629 Squamous cell carcinoma of skin of left upper limb, including shoulder: Secondary | ICD-10-CM | POA: Diagnosis not present

## 2021-12-26 DIAGNOSIS — L650 Telogen effluvium: Secondary | ICD-10-CM | POA: Diagnosis not present

## 2022-01-30 DIAGNOSIS — Z1231 Encounter for screening mammogram for malignant neoplasm of breast: Secondary | ICD-10-CM | POA: Diagnosis not present

## 2022-01-30 DIAGNOSIS — Z01419 Encounter for gynecological examination (general) (routine) without abnormal findings: Secondary | ICD-10-CM | POA: Diagnosis not present

## 2022-01-30 DIAGNOSIS — Z79890 Hormone replacement therapy (postmenopausal): Secondary | ICD-10-CM | POA: Diagnosis not present

## 2022-02-18 DIAGNOSIS — M797 Fibromyalgia: Secondary | ICD-10-CM | POA: Diagnosis not present

## 2022-02-18 DIAGNOSIS — Z1389 Encounter for screening for other disorder: Secondary | ICD-10-CM | POA: Diagnosis not present

## 2022-02-18 DIAGNOSIS — E7800 Pure hypercholesterolemia, unspecified: Secondary | ICD-10-CM | POA: Diagnosis not present

## 2022-02-18 DIAGNOSIS — F325 Major depressive disorder, single episode, in full remission: Secondary | ICD-10-CM | POA: Diagnosis not present

## 2022-02-18 DIAGNOSIS — Z0000 Encounter for general adult medical examination without abnormal findings: Secondary | ICD-10-CM | POA: Diagnosis not present

## 2022-02-18 DIAGNOSIS — F5101 Primary insomnia: Secondary | ICD-10-CM | POA: Diagnosis not present

## 2022-03-26 DIAGNOSIS — H26492 Other secondary cataract, left eye: Secondary | ICD-10-CM | POA: Diagnosis not present

## 2022-03-26 DIAGNOSIS — Z961 Presence of intraocular lens: Secondary | ICD-10-CM | POA: Diagnosis not present

## 2022-03-26 DIAGNOSIS — H18413 Arcus senilis, bilateral: Secondary | ICD-10-CM | POA: Diagnosis not present

## 2022-03-26 DIAGNOSIS — H26493 Other secondary cataract, bilateral: Secondary | ICD-10-CM | POA: Diagnosis not present

## 2022-04-03 DIAGNOSIS — H26491 Other secondary cataract, right eye: Secondary | ICD-10-CM | POA: Diagnosis not present

## 2022-04-03 DIAGNOSIS — Z9842 Cataract extraction status, left eye: Secondary | ICD-10-CM | POA: Diagnosis not present

## 2022-04-03 DIAGNOSIS — H5211 Myopia, right eye: Secondary | ICD-10-CM | POA: Diagnosis not present

## 2022-04-03 DIAGNOSIS — H524 Presbyopia: Secondary | ICD-10-CM | POA: Diagnosis not present

## 2022-04-03 DIAGNOSIS — H52223 Regular astigmatism, bilateral: Secondary | ICD-10-CM | POA: Diagnosis not present

## 2022-05-21 DIAGNOSIS — Z85828 Personal history of other malignant neoplasm of skin: Secondary | ICD-10-CM | POA: Diagnosis not present

## 2022-05-21 DIAGNOSIS — L718 Other rosacea: Secondary | ICD-10-CM | POA: Diagnosis not present

## 2022-05-21 DIAGNOSIS — I788 Other diseases of capillaries: Secondary | ICD-10-CM | POA: Diagnosis not present

## 2022-07-26 DIAGNOSIS — H00022 Hordeolum internum right lower eyelid: Secondary | ICD-10-CM | POA: Diagnosis not present

## 2022-07-26 DIAGNOSIS — H5211 Myopia, right eye: Secondary | ICD-10-CM | POA: Diagnosis not present

## 2022-07-26 DIAGNOSIS — H52223 Regular astigmatism, bilateral: Secondary | ICD-10-CM | POA: Diagnosis not present

## 2022-07-26 DIAGNOSIS — H524 Presbyopia: Secondary | ICD-10-CM | POA: Diagnosis not present

## 2022-08-27 DIAGNOSIS — L820 Inflamed seborrheic keratosis: Secondary | ICD-10-CM | POA: Diagnosis not present

## 2022-08-27 DIAGNOSIS — D485 Neoplasm of uncertain behavior of skin: Secondary | ICD-10-CM | POA: Diagnosis not present

## 2022-08-27 DIAGNOSIS — Z85828 Personal history of other malignant neoplasm of skin: Secondary | ICD-10-CM | POA: Diagnosis not present

## 2022-08-27 DIAGNOSIS — L718 Other rosacea: Secondary | ICD-10-CM | POA: Diagnosis not present

## 2022-08-27 DIAGNOSIS — D1801 Hemangioma of skin and subcutaneous tissue: Secondary | ICD-10-CM | POA: Diagnosis not present

## 2022-08-27 DIAGNOSIS — L812 Freckles: Secondary | ICD-10-CM | POA: Diagnosis not present

## 2022-08-27 DIAGNOSIS — L570 Actinic keratosis: Secondary | ICD-10-CM | POA: Diagnosis not present

## 2022-08-29 DIAGNOSIS — F5101 Primary insomnia: Secondary | ICD-10-CM | POA: Diagnosis not present

## 2022-08-29 DIAGNOSIS — Z79899 Other long term (current) drug therapy: Secondary | ICD-10-CM | POA: Diagnosis not present

## 2022-08-29 DIAGNOSIS — F325 Major depressive disorder, single episode, in full remission: Secondary | ICD-10-CM | POA: Diagnosis not present

## 2022-08-29 DIAGNOSIS — M797 Fibromyalgia: Secondary | ICD-10-CM | POA: Diagnosis not present

## 2022-08-29 DIAGNOSIS — Z23 Encounter for immunization: Secondary | ICD-10-CM | POA: Diagnosis not present

## 2022-08-29 DIAGNOSIS — E7800 Pure hypercholesterolemia, unspecified: Secondary | ICD-10-CM | POA: Diagnosis not present

## 2022-08-29 DIAGNOSIS — G8929 Other chronic pain: Secondary | ICD-10-CM | POA: Diagnosis not present

## 2022-08-29 DIAGNOSIS — M170 Bilateral primary osteoarthritis of knee: Secondary | ICD-10-CM | POA: Diagnosis not present

## 2022-10-19 DIAGNOSIS — R058 Other specified cough: Secondary | ICD-10-CM | POA: Diagnosis not present

## 2022-10-19 DIAGNOSIS — J40 Bronchitis, not specified as acute or chronic: Secondary | ICD-10-CM | POA: Diagnosis not present

## 2022-10-21 DIAGNOSIS — Z20822 Contact with and (suspected) exposure to covid-19: Secondary | ICD-10-CM | POA: Diagnosis not present

## 2022-10-21 DIAGNOSIS — J029 Acute pharyngitis, unspecified: Secondary | ICD-10-CM | POA: Diagnosis not present

## 2022-10-21 DIAGNOSIS — J189 Pneumonia, unspecified organism: Secondary | ICD-10-CM | POA: Diagnosis not present

## 2022-10-21 DIAGNOSIS — R059 Cough, unspecified: Secondary | ICD-10-CM | POA: Diagnosis not present

## 2022-10-23 DIAGNOSIS — J159 Unspecified bacterial pneumonia: Secondary | ICD-10-CM | POA: Diagnosis not present

## 2022-10-26 ENCOUNTER — Emergency Department (HOSPITAL_BASED_OUTPATIENT_CLINIC_OR_DEPARTMENT_OTHER): Payer: Medicare Other

## 2022-10-26 ENCOUNTER — Other Ambulatory Visit: Payer: Self-pay

## 2022-10-26 ENCOUNTER — Inpatient Hospital Stay (HOSPITAL_BASED_OUTPATIENT_CLINIC_OR_DEPARTMENT_OTHER)
Admission: EM | Admit: 2022-10-26 | Discharge: 2022-10-28 | DRG: 871 | Disposition: A | Discharge status: Home / Self Care | Payer: Medicare Other | Attending: Internal Medicine | Admitting: Internal Medicine

## 2022-10-26 ENCOUNTER — Emergency Department (HOSPITAL_BASED_OUTPATIENT_CLINIC_OR_DEPARTMENT_OTHER): Payer: Medicare Other | Admitting: Radiology

## 2022-10-26 ENCOUNTER — Encounter (HOSPITAL_COMMUNITY): Payer: Self-pay

## 2022-10-26 DIAGNOSIS — R12 Heartburn: Secondary | ICD-10-CM | POA: Diagnosis present

## 2022-10-26 DIAGNOSIS — Z885 Allergy status to narcotic agent status: Secondary | ICD-10-CM | POA: Diagnosis not present

## 2022-10-26 DIAGNOSIS — Z90710 Acquired absence of both cervix and uterus: Secondary | ICD-10-CM

## 2022-10-26 DIAGNOSIS — F32A Depression, unspecified: Secondary | ICD-10-CM | POA: Diagnosis present

## 2022-10-26 DIAGNOSIS — T470X5A Adverse effect of histamine H2-receptor blockers, initial encounter: Secondary | ICD-10-CM | POA: Diagnosis not present

## 2022-10-26 DIAGNOSIS — J45909 Unspecified asthma, uncomplicated: Secondary | ICD-10-CM | POA: Diagnosis present

## 2022-10-26 DIAGNOSIS — F419 Anxiety disorder, unspecified: Secondary | ICD-10-CM | POA: Diagnosis not present

## 2022-10-26 DIAGNOSIS — R509 Fever, unspecified: Secondary | ICD-10-CM | POA: Diagnosis not present

## 2022-10-26 DIAGNOSIS — J4541 Moderate persistent asthma with (acute) exacerbation: Secondary | ICD-10-CM | POA: Diagnosis not present

## 2022-10-26 DIAGNOSIS — K449 Diaphragmatic hernia without obstruction or gangrene: Secondary | ICD-10-CM | POA: Diagnosis present

## 2022-10-26 DIAGNOSIS — J189 Pneumonia, unspecified organism: Secondary | ICD-10-CM | POA: Diagnosis present

## 2022-10-26 DIAGNOSIS — Y92009 Unspecified place in unspecified non-institutional (private) residence as the place of occurrence of the external cause: Secondary | ICD-10-CM | POA: Diagnosis not present

## 2022-10-26 DIAGNOSIS — Z800 Family history of malignant neoplasm of digestive organs: Secondary | ICD-10-CM

## 2022-10-26 DIAGNOSIS — Z1152 Encounter for screening for COVID-19: Secondary | ICD-10-CM | POA: Diagnosis not present

## 2022-10-26 DIAGNOSIS — J129 Viral pneumonia, unspecified: Secondary | ICD-10-CM | POA: Diagnosis present

## 2022-10-26 DIAGNOSIS — R0602 Shortness of breath: Secondary | ICD-10-CM | POA: Diagnosis not present

## 2022-10-26 DIAGNOSIS — A419 Sepsis, unspecified organism: Secondary | ICD-10-CM | POA: Diagnosis not present

## 2022-10-26 DIAGNOSIS — R059 Cough, unspecified: Secondary | ICD-10-CM | POA: Diagnosis not present

## 2022-10-26 DIAGNOSIS — M797 Fibromyalgia: Secondary | ICD-10-CM | POA: Diagnosis not present

## 2022-10-26 LAB — BLOOD GAS, VENOUS
Acid-Base Excess: 0.7 mmol/L (ref 0.0–2.0)
Bicarbonate: 24.3 mmol/L (ref 20.0–28.0)
O2 Saturation: 87.8 %
Patient temperature: 37
pCO2, Ven: 35 mmHg — ABNORMAL LOW (ref 44–60)
pH, Ven: 7.45 — ABNORMAL HIGH (ref 7.25–7.43)
pO2, Ven: 61 mmHg — ABNORMAL HIGH (ref 32–45)

## 2022-10-26 LAB — BASIC METABOLIC PANEL WITH GFR
Anion gap: 12 (ref 5–15)
BUN: 17 mg/dL (ref 8–23)
CO2: 25 mmol/L (ref 22–32)
Calcium: 9.1 mg/dL (ref 8.9–10.3)
Chloride: 101 mmol/L (ref 98–111)
Creatinine, Ser: 0.92 mg/dL (ref 0.44–1.00)
GFR, Estimated: >60 mL/min
Glucose, Bld: 155 mg/dL — ABNORMAL HIGH (ref 70–99)
Potassium: 4 mmol/L (ref 3.5–5.1)
Sodium: 138 mmol/L (ref 135–145)

## 2022-10-26 LAB — CBC WITH DIFFERENTIAL/PLATELET
Abs Immature Granulocytes: 0.11 K/uL — ABNORMAL HIGH (ref 0.00–0.07)
Basophils Absolute: 0 K/uL (ref 0.0–0.1)
Basophils Relative: 0 %
Eosinophils Absolute: 0 K/uL (ref 0.0–0.5)
Eosinophils Relative: 0 %
HCT: 42.8 % (ref 36.0–46.0)
Hemoglobin: 14.2 g/dL (ref 12.0–15.0)
Immature Granulocytes: 1 %
Lymphocytes Relative: 7 %
Lymphs Abs: 0.8 K/uL (ref 0.7–4.0)
MCH: 31.1 pg (ref 26.0–34.0)
MCHC: 33.2 g/dL (ref 30.0–36.0)
MCV: 93.7 fL (ref 80.0–100.0)
Monocytes Absolute: 0.8 K/uL (ref 0.1–1.0)
Monocytes Relative: 7 %
Neutro Abs: 9.2 K/uL — ABNORMAL HIGH (ref 1.7–7.7)
Neutrophils Relative %: 85 %
Platelets: 350 K/uL (ref 150–400)
RBC: 4.57 MIL/uL (ref 3.87–5.11)
RDW: 12.4 % (ref 11.5–15.5)
WBC: 10.9 K/uL — ABNORMAL HIGH (ref 4.0–10.5)
nRBC: 0 % (ref 0.0–0.2)

## 2022-10-26 LAB — LACTIC ACID, PLASMA: Lactic Acid, Venous: 2.2 mmol/L (ref 0.5–1.9)

## 2022-10-26 LAB — PROTIME-INR
INR: 1 (ref 0.8–1.2)
Prothrombin Time: 13.3 seconds (ref 11.4–15.2)

## 2022-10-26 LAB — BRAIN NATRIURETIC PEPTIDE: B Natriuretic Peptide: 51.7 pg/mL (ref 0.0–100.0)

## 2022-10-26 LAB — CK: Total CK: 65 U/L (ref 38–234)

## 2022-10-26 LAB — RESP PANEL BY RT-PCR (FLU A&B, COVID) ARPGX2
Influenza A by PCR: NEGATIVE
Influenza B by PCR: NEGATIVE
SARS Coronavirus 2 by RT PCR: NEGATIVE

## 2022-10-26 LAB — TROPONIN I (HIGH SENSITIVITY): Troponin I (High Sensitivity): 5 ng/L

## 2022-10-26 LAB — APTT: aPTT: 27 seconds (ref 24–36)

## 2022-10-26 LAB — PHOSPHORUS: Phosphorus: 3.2 mg/dL (ref 2.5–4.6)

## 2022-10-26 LAB — MAGNESIUM: Magnesium: 2.4 mg/dL (ref 1.7–2.4)

## 2022-10-26 MED ORDER — SODIUM CHLORIDE 0.9 % IV SOLN
2.0000 g | Freq: Two times a day (BID) | INTRAVENOUS | Status: DC
  Administered 2022-10-26 – 2022-10-28 (×4): 2 g via INTRAVENOUS
  Filled 2022-10-26 (×4): qty 12.5

## 2022-10-26 MED ORDER — TRAMADOL HCL 50 MG PO TABS
50.0000 mg | ORAL_TABLET | Freq: Two times a day (BID) | ORAL | Status: DC
  Administered 2022-10-27 – 2022-10-28 (×4): 50 mg via ORAL
  Filled 2022-10-26 (×4): qty 1

## 2022-10-26 MED ORDER — ALBUTEROL SULFATE (2.5 MG/3ML) 0.083% IN NEBU: 2.5000 mg | INHALATION_SOLUTION | RESPIRATORY_TRACT | Status: DC | PRN

## 2022-10-26 MED ORDER — GUAIFENESIN ER 600 MG PO TB12
600.0000 mg | ORAL_TABLET | Freq: Two times a day (BID) | ORAL | Status: DC
  Administered 2022-10-27 – 2022-10-28 (×4): 600 mg via ORAL
  Filled 2022-10-26 (×4): qty 1

## 2022-10-26 MED ORDER — VANCOMYCIN HCL 500 MG IV SOLR
500.0000 mg | Freq: Once | INTRAVENOUS | Status: AC
  Administered 2022-10-26: 500 mg via INTRAVENOUS

## 2022-10-26 MED ORDER — CLONAZEPAM 0.5 MG PO TABS
0.5000 mg | ORAL_TABLET | Freq: Every day | ORAL | Status: DC
  Administered 2022-10-27 (×2): 0.5 mg via ORAL
  Filled 2022-10-26 (×2): qty 1

## 2022-10-26 MED ORDER — PREDNISONE 20 MG PO TABS
40.0000 mg | ORAL_TABLET | Freq: Every day | ORAL | Status: DC
  Filled 2022-10-26: qty 2

## 2022-10-26 MED ORDER — IPRATROPIUM-ALBUTEROL 0.5-2.5 (3) MG/3ML IN SOLN
3.0000 mL | Freq: Four times a day (QID) | RESPIRATORY_TRACT | Status: DC
  Administered 2022-10-27 (×2): 3 mL via RESPIRATORY_TRACT
  Filled 2022-10-26 (×2): qty 3

## 2022-10-26 MED ORDER — VANCOMYCIN HCL IN DEXTROSE 1-5 GM/200ML-% IV SOLN
1000.0000 mg | INTRAVENOUS | Status: DC
  Administered 2022-10-27: 1000 mg via INTRAVENOUS
  Filled 2022-10-26 (×2): qty 200

## 2022-10-26 MED ORDER — LACTATED RINGERS IV BOLUS
1000.0000 mL | Freq: Once | INTRAVENOUS | Status: AC
  Administered 2022-10-26: 1000 mL via INTRAVENOUS

## 2022-10-26 MED ORDER — METHYLPREDNISOLONE SODIUM SUCC 40 MG IJ SOLR
40.0000 mg | INTRAMUSCULAR | Status: DC
  Administered 2022-10-27: 40 mg via INTRAVENOUS
  Filled 2022-10-26: qty 1

## 2022-10-26 MED ORDER — ALBUTEROL SULFATE HFA 108 (90 BASE) MCG/ACT IN AERS: 2.0000 | INHALATION_SPRAY | RESPIRATORY_TRACT | Status: DC | PRN

## 2022-10-26 MED ORDER — VANCOMYCIN HCL 500 MG IV SOLR
500.0000 mg | Freq: Two times a day (BID) | INTRAVENOUS | Status: DC
  Filled 2022-10-26: qty 10

## 2022-10-26 MED ORDER — VANCOMYCIN HCL IN DEXTROSE 1-5 GM/200ML-% IV SOLN
1000.0000 mg | Freq: Once | INTRAVENOUS | Status: AC
  Administered 2022-10-26: 1000 mg via INTRAVENOUS
  Filled 2022-10-26: qty 200

## 2022-10-26 MED ORDER — ENOXAPARIN SODIUM 40 MG/0.4ML IJ SOSY
40.0000 mg | PREFILLED_SYRINGE | INTRAMUSCULAR | Status: DC
  Administered 2022-10-27 (×2): 40 mg via SUBCUTANEOUS
  Filled 2022-10-26 (×2): qty 0.4

## 2022-10-26 MED ORDER — VANCOMYCIN HCL 500 MG/100ML IV SOLN
500.0000 mg | Freq: Two times a day (BID) | INTRAVENOUS | Status: DC
  Filled 2022-10-26: qty 100

## 2022-10-26 MED ORDER — ACETAMINOPHEN 650 MG RE SUPP: 650.0000 mg | Freq: Four times a day (QID) | RECTAL | Status: DC | PRN

## 2022-10-26 MED ORDER — DULOXETINE HCL 60 MG PO CPEP
60.0000 mg | ORAL_CAPSULE | Freq: Every day | ORAL | Status: DC
  Administered 2022-10-27 – 2022-10-28 (×2): 60 mg via ORAL
  Filled 2022-10-26 (×2): qty 1

## 2022-10-26 MED ORDER — IOHEXOL 350 MG/ML SOLN
100.0000 mL | Freq: Once | INTRAVENOUS | Status: AC | PRN
  Administered 2022-10-26: 100 mL via INTRAVENOUS

## 2022-10-26 MED ORDER — SODIUM CHLORIDE 0.9 % IV SOLN: INTRAVENOUS | Status: DC

## 2022-10-26 MED ORDER — SODIUM CHLORIDE 0.9% FLUSH
3.0000 mL | Freq: Two times a day (BID) | INTRAVENOUS | Status: DC
  Administered 2022-10-27 – 2022-10-28 (×2): 3 mL via INTRAVENOUS

## 2022-10-26 MED ORDER — SODIUM CHLORIDE 0.9 % IV SOLN
1.0000 g | Freq: Once | INTRAVENOUS | Status: AC
  Administered 2022-10-26: 1 g via INTRAVENOUS

## 2022-10-26 MED ORDER — ALBUTEROL SULFATE (2.5 MG/3ML) 0.083% IN NEBU
2.5000 mg | INHALATION_SOLUTION | RESPIRATORY_TRACT | Status: DC | PRN
  Administered 2022-10-26: 2.5 mg via RESPIRATORY_TRACT
  Filled 2022-10-26: qty 3

## 2022-10-26 MED ORDER — ACETAMINOPHEN 325 MG PO TABS
650.0000 mg | ORAL_TABLET | Freq: Four times a day (QID) | ORAL | Status: DC | PRN
  Administered 2022-10-27 – 2022-10-28 (×2): 650 mg via ORAL
  Filled 2022-10-26 (×2): qty 2

## 2022-10-26 NOTE — Assessment & Plan Note (Signed)
Initially meeting sepsis criteria with tachycardia increased respirations fever mild white blood cell count elevation.  Obtain blood cultures Sputum cultures Check UA In the emergency department started on cefepime and Vanco for failure of outpatient treatment. Will continue for now and await results of further workup

## 2022-10-26 NOTE — ED Triage Notes (Addendum)
Patient arrives ambulatory to triage with complaints of shortness of breath, cough, and intermittent fever (high of 100.73F) x1 week. Shortness of breath is worse with ambulation. Patient was recently diagnosed with pneumonia  Finished her course of antibiotics and she is currently on steroids.  Reports no pain

## 2022-10-26 NOTE — H&P (Signed)
Julie Duran [facility] PPI:[id number] DOB: [birth year] DOA: 10/26/2022     PCP: Carol Ada, MD   Outpatient Specialists: NONE   Patient arrived to ER on 10/26/22 at 1014 Referred by Attending Toy Baker, MD   Patient coming from:    home Lives With family    Chief Complaint:   Chief Complaint  Patient presents with   Shortness of Breath   Fever    HPI: Julie Duran is a 76 y.o. female with medical history significant of anxiety Fibromyalgia    Presented with cough shortness of breath fever wheezing  Patient reports cough fever shortness of breath temperature up to 100.3 lasting for past 1 week shortness of breath worse with ambulation was recently diagnosed with pneumonia has finished a course of antibiotics Augmentin and was started on steroids recently doxycycline was added to her course but she still has a cough Cough productive of yellow to green sputum Does not smoke nor drink  Now on prednisone 30 mg for one more day 7 days ago she was started on prednisone 42mgpo for 3 day and 30 mg po q day She though she was doing better but last night she started to have fever again  No sick contacts No hemoptysis She has been wheezing a bit   Initial COVID TEST  NEGATIVE   Lab Results  Component Value Date   [place]/25/2023     While in ER: Clinical Course as of 10/26/22 2041  Sat Oct 26, 2022  1355 WBC(!): 10.9 [JL]    Clinical Course User Index [JL] LRegan Lemming MD      CXR - Retrocardiac opacity suspicious for pneumonia. Recommend follow-up two-view chest radiographs in 3-4 weeks to ensure resolution   CTA chest -  no PE, Left lower lobe bronchopneumonia. Follow-up in 4-6 weeks after treatment is recommended to exclude underlying mass. Negative for pulmonary embolism.  Following Medications were ordered in ER: Medications  albuterol (PROVENTIL) (2.5 MG/3ML) 0.083% nebulizer solution 2.5 mg (has no  administration in time range)  vancomycin (VANCOREADY) IVPB 500 mg/100 mL (has no administration in time range)  ceFEPIme (MAXIPIME) 1 g in sodium chloride 0.9 % 100 mL IVPB (0 g Intravenous Stopped 10/26/22 1600)  lactated ringers bolus 1,000 mL (0 mLs Intravenous Stopped 10/26/22 1600)  vancomycin (VANCOCIN) IVPB 1000 mg/200 mL premix (0 mg Intravenous Stopped 10/26/22 1617)    Followed by  vancomycin (VANCOCIN) 500 mg in sodium chloride 0.9 % 100 mL IVPB (0 mg Intravenous Stopped 10/26/22 1617)  iohexol (OMNIPAQUE) 350 MG/ML injection 100 mL (100 mLs Intravenous Contrast Given 10/26/22 1519)       ED Triage Vitals  Enc Vitals Group     BP 10/26/22 1024 118/72     Pulse Rate 10/26/22 1024 (!) 107     Resp 10/26/22 1024 20     Temp 10/26/22 1024 98 F (36.7 C)     Temp Source 10/26/22 1024 Oral     SpO2 10/26/22 1024 95 %     Weight 10/26/22 1029 141 lb 1.5 oz (64 kg)     Height 10/26/22 1029 '5\' 7"'$  (1.702 m)     Head Circumference --      Peak Flow --      Pain Score 10/26/22 1029 0     Pain Loc --      Pain Edu? --      Excl. in [place] --   TMAX(24)@     _________________________________________ Significant  initial  Findings: Abnormal Labs Reviewed  CBC WITH DIFFERENTIAL/PLATELET - Abnormal; Notable for the following components:      Result Value   WBC 10.9 (*)    Neutro Abs 9.2 (*)    Abs Immature Granulocytes 0.11 (*)    All other components within normal limits  BASIC METABOLIC PANEL - Abnormal; Notable for the following components:   Glucose, Bld 155 (*)    All other components within normal limits   _____________________ Troponin 5  ECG: Ordered Personally reviewed and interpreted by me showing: HR :  104 Rhythm: NSR, Sinus tachycardia Sinus tachycardia Right atrial enlargement  QTC 410  _______________ This patient meets SIRS Criteria and may be septic.    The recent clinical data is shown below. Vitals:   10/26/22 1830 10/26/22 1900 10/26/22 1930 10/26/22  1956  BP: 135/78 (!) 141/71 131/74   Pulse: 79 (!) 102 80   Resp: (!) 21  20   Temp:    98.2 F (36.8 C)  TempSrc:    Oral  SpO2: 97% 93% 95%   Weight:      Height:        WBC     Component Value Date/Time   WBC 10.9 (H) 10/26/2022 1237   LYMPHSABS 0.8 10/26/2022 1237   MONOABS 0.8 10/26/2022 1237   EOSABS 0.0 10/26/2022 1237   BASOSABS 0.0 10/26/2022 1237    Lactic Acid, Venous No results found for: "LATICACIDVEN"   Procalcitonin   Ordered     UA  ordered   Urine analysis: No results found for: "COLORURINE", "APPEARANCEUR", "LABSPEC", "PHURINE", "GLUCOSEU", "HGBUR", "BILIRUBINUR", "KETONESUR", "PROTEINUR", "UROBILINOGEN", "NITRITE", "LEUKOCYTESUR"  Results for orders placed or performed during the hospital encounter of 10/26/22  Resp Panel by RT-PCR (Flu A&B, Covid) Anterior Nasal Swab     Status: None   Collection Time: 10/26/22 10:31 AM   Specimen: Anterior Nasal Swab  Result Value Ref Range Status   SARS Coronavirus 2 by RT PCR NEGATIVE NEGATIVE Final         Influenza A by PCR NEGATIVE NEGATIVE Final   Influenza B by PCR NEGATIVE NEGATIVE Final          _________________________________ Hospitalist was called for admission for   Community acquired pneumonia of left lung, unspecified part of lung     The following Work up has been ordered so far:  Orders Placed This Encounter  Procedures   Resp Panel by RT-PCR (Flu A&B, Covid) Anterior Nasal Swab   Expectorated Sputum Assessment w Gram Stain, Rflx to Resp Cult   MRSA Next Gen by PCR, Nasal   DG Chest 2 View   CT Angio Chest PE W and/or Wo Contrast   CBC with Differential   Brain natriuretic peptide   Basic metabolic panel   Magnesium   Cardiac monitoring   Utilize spacer/aerochamber with mdi inhaler for COVID-19 positive patients or PUI for COVID-19   If O2 Sat <94% administer O2 at 2 liters/minute via nasal cannula   vancomycin per pharmacy consult   Consult to hospitalist   Airborne and Contact  precautions   Pulse oximetry, continuous   Adult wheeze protocol - initiate by RT   EKG 12-Lead   Admit to Inpatient (patient's expected length of stay will be greater than 2 midnights or inpatient only procedure)     OTHER Significant initial  Findings:  labs showing:    Recent Labs  Lab 10/26/22 1338  NA 138  K 4.0  CO2 25  GLUCOSE 155*  BUN 17  CREATININE 0.92  CALCIUM 9.1  MG 2.4    Cr   stable,    Lab Results  Component Value Date   CREATININE 0.92 10/26/2022    No results for input(s): "AST", "ALT", "ALKPHOS", "BILITOT", "PROT", "ALBUMIN" in the last 168 hours. Lab Results  Component Value Date   CALCIUM 9.1 10/26/2022    Plt: Lab Results  Component Value Date   PLT 350 10/26/2022       Recent Labs  Lab 10/26/22 1237  WBC 10.9*  NEUTROABS 9.2*  HGB 14.2  HCT 42.8  MCV 93.7  PLT 350    HG/HCT   stable,     Component Value Date/Time   HGB 14.2 10/26/2022 1237   HCT 42.8 10/26/2022 1237   MCV 93.7 10/26/2022 1237      BNP (last 3 results) Recent Labs    10/26/22 1237  BNP 51.7        Cultures: No results found for: "SDES", "SPECREQUEST", "CULT", "REPTSTATUS"   Radiological Exams on Admission: CT Angio Chest PE W and/or Wo Contrast  Result Date: 10/26/2022 CLINICAL DATA:  Shortness of breath with cough and intermittent fever; PE suspected EXAM: CT ANGIOGRAPHY CHEST WITH CONTRAST TECHNIQUE: Multidetector CT imaging of the chest was performed using the standard protocol during bolus administration of intravenous contrast. Multiplanar CT image reconstructions and MIPs were obtained to evaluate the vascular anatomy. RADIATION DOSE REDUCTION: This exam was performed according to the departmental dose-optimization program which includes automated exposure control, adjustment of the mA and/or kV according to patient size and/or use of iterative reconstruction technique. CONTRAST:  150m OMNIPAQUE IOHEXOL 350 MG/ML SOLN COMPARISON:  Radiographs  earlier today FINDINGS: Cardiovascular: Satisfactory opacification of the pulmonary arteries to the segmental level. No evidence of pulmonary embolism. Mild coronary artery atherosclerotic calcification. Normal heart size. No pericardial effusion. Aortic calcification. Mediastinum/Nodes: No enlarged mediastinal, hilar, or axillary lymph nodes. Small hiatal hernia. Lungs/Pleura: Bronchial wall thickening with mucous plugging in the left lower lobe. Associated peribronchovascular consolidation and multiple centrilobular micro nodules in the left lower lobe and to a lesser extent in the lingula. 4 mm nodule in the posterior right lower lobe (9/91). Upper Abdomen: No acute abnormality. Musculoskeletal: No chest wall abnormality. No acute osseous findings. Review of the MIP images confirms the above findings. IMPRESSION: Left lower lobe bronchopneumonia. Follow-up in 4-6 weeks after treatment is recommended to exclude underlying mass. Negative for pulmonary embolism. Aortic Atherosclerosis (ICD10-I70.0). Electronically Signed   By: TPlacido SouM.D.   On: 10/26/2022 15:44   DG Chest 2 View  Result Date: 10/26/2022 CLINICAL DATA:  Shortness of breath, cough, intermittent fever. EXAM: CHEST - 2 VIEW COMPARISON:  Chest radiograph 05/24/2009 FINDINGS: The cardiomediastinal silhouette is normal. There is retrocardiac opacity best seen on the lateral projection. There is no other focal airspace disease. There is no pulmonary edema. There is no pleural effusion or pneumothorax There is no acute osseous abnormality. IMPRESSION: Retrocardiac opacity suspicious for pneumonia. Recommend follow-up two-view chest radiographs in 3-4 weeks to ensure resolution. Electronically Signed   By: PValetta MoleM.D.   On: 10/26/2022 11:05   _______________________________________________________________________________________________________ Latest  Blood pressure 131/74, pulse 80, temperature 98.2 F (36.8 C), temperature source  Oral, resp. rate 20, height '5\' 7"'$  (1.702 m), weight 64 kg, SpO2 95 %.   Vitals  labs and radiology finding personally reviewed  Review of Systems:    Pertinent positives include:   Fevers, chills,   shortness  of breath at rest. No dyspnea on exertion, Constitutional:  No weight loss, night sweats,fatigue, weight loss  HEENT:  No headaches, Difficulty swallowing,Tooth/dental problems,Sore throat,  No sneezing, itching, ear ache, nasal congestion, post nasal drip,  Cardio-vascular:  No chest pain, Orthopnea, PND, anasarca, dizziness, palpitations.no Bilateral lower extremity swelling  GI:  No heartburn, indigestion, abdominal pain, nausea, vomiting, diarrhea, change in bowel habits, loss of appetite, melena, blood in stool, hematemesis Resp:  no  No excess mucus, no productive cough, No non-productive cough, No coughing up of blood.No change in color of mucus.No wheezing. Skin:  no rash or lesions. No jaundice GU:  no dysuria, change in color of urine, no urgency or frequency. No straining to urinate.  No flank pain.  Musculoskeletal:  No joint pain or no joint swelling. No decreased range of motion. No back pain.  Psych:  No change in mood or affect. No depression or anxiety. No memory loss.  Neuro: no localizing neurological complaints, no tingling, no weakness, no double vision, no gait abnormality, no slurred speech, no confusion  All systems reviewed and apart from [place] all are negative _______________________________________________________________________________________________ Past Medical History:   Past Medical History:  Diagnosis Date   Bronchitis    Depression    Fibromyalgia       Past Surgical History:  Procedure Laterality Date   HAND SURGERY Left 2008   HAND SURGERY Right 2012   TOTAL ABDOMINAL HYSTERECTOMY  1982    Social History:  Ambulatory  independently      reports that she has never smoked. She has never used smokeless tobacco. She reports  that she does not drink alcohol. No history on file for drug use.   Family History: No hx of CAd Father with colon cancer ______________________________________________________________________________________________ Allergies: Allergies  Allergen Reactions   Oxycodone-Acetaminophen      Prior to Admission medications   Medication Sig Start Date End Date Taking? Authorizing Provider  Bioflavonoid Products (VITAMIN C PLUS) 1000 MG TABS Vitamin C 1,000 mg tablet  Take by oral route.    [provider]  calcium carbonate (OS-CAL) 600 MG TABS tablet calcium carbonate    [provider]  clonazePAM (KLONOPIN) 0.5 MG tablet clonazepam 0.5 mg tablet    [provider]  clonazePAM (KLONOPIN) 0.5 MG tablet  09/10/17   [provider]  cyclobenzaprine (FLEXERIL) 10 MG tablet cyclobenzaprine 10 mg tablet    [provider]  DULoxetine (CYMBALTA) 60 MG capsule  12/28/17   [provider]  ESTRACE VAGINAL 0.1 MG/GM vaginal cream  10/30/17   [provider]  estradiol (ESTRACE VAGINAL) 0.1 MG/GM vaginal cream Estrace 0.01% (0.1 mg/gram) vaginal cream  1/2 gram intravaginally 2 X week    [provider]  gabapentin (NEURONTIN) 100 MG capsule gabapentin 100 mg capsule    [provider]  Magnesium 500 MG CAPS magnesium    [provider]  Melatonin 1 MG CAPS Take 2 mg by mouth as needed.    [provider]  Multiple Vitamins-Minerals (MULTIVITAMIN ADULT EXTRA C PO) Multivitamin 50 Plus    [provider]  Polyethylene Glycol 3350 (MIRALAX PO) Miralax    [provider]  Probiotic Product (PROBIOTIC-10 PO) Probiotic    [provider]  traMADol (ULTRAM) 50 MG tablet Take 50 mg by mouth 2 (two) times daily. 12/28/17   [provider]    ___________________________________________________________________________________________________ Physical Exam:    10/26/2022     7:30 PM 10/26/2022  7:00 PM 10/26/2022    6:30 PM  Vitals with BMI  Systolic 161 096 045  Diastolic 74 71 78  Pulse 80 102 79     1. General:  in No  Acute distress    Chronically ill   -appearing 2. Psychological: Alert and   Oriented 3. Head/ENT:   Dry Mucous Membranes                          Head Non traumatic, neck supple                       Poor Dentition 4. SKIN: decreased Skin turgor,  Skin clean Dry and intact no rash 5. Heart: Regular rate and rhythm no  Murmur, no Rub or gallop 6. Lungs: some  wheezes and crackles   7. Abdomen: Soft,  non-tender, Non distended bowel sounds present 8. Lower extremities: no clubbing, cyanosis, no  edema 9. Neurologically Grossly intact, moving all 4 extremities equally   10. MSK: Normal range of motion    Chart has been reviewed  ______________________________________________________________________________________________  Assessment/Plan 76 y.o. female with medical history significant of anxiety Fibromyalgia   Admitted for   Community acquired pneumonia of left lung, unspecified part of lung     Present on Admission:  Community acquired pneumonia  Sepsis ([place])  Reactive airway disease     Community acquired pneumonia  - -Patient presenting with  productive cough, fever    and infiltrate in   lower lobe on chest x-ray -Infiltrate on CXR and 2-3 characteristics (fever, leukocytosis, purulent sputum) are consistent with pneumonia. -This appears to be most likely community-acquired pneumonia failing outpatient treatment     will admit for treatment of CAP will start on appropriate antibiotic coverage. -Given that patient failed multiple courses of antibiotics will treat broadly with cefepime Vanco   Obtain:  sputum cultures,                  Obtain respiratory panel                    influenza serologies negative                  COVID PCR negative                     blood cultures and sputum cultures ordered                    strep pneumo UA antigen,                    check for Legionella antigen.                Provide oxygen as needed.    Sepsis ([place]) Initially meeting sepsis criteria with tachycardia increased respirations fever mild white blood cell count elevation.  Obtain blood cultures Sputum cultures Check UA In the emergency department started on cefepime and Vanco for failure of outpatient treatment. Will continue for now and await results of further workup  Reactive airway disease  -   Will initiate: Steroid taper - Albuterol  RN, - scheduled duoneb,   Titrate O2 to saturation >90%. Follow patients respiratory status.   nfluenza PCR neg  VBG ordered    Currently mentating well no evidence of symptomatic hypercarbia    Other plan as per orders.  DVT prophylaxis:   Lovenox  Code Status:    Code Status: Not on file FULL CODE   as per patient   I had personally discussed CODE STATUS with patient     Family Communication:   Family at  Bedside  plan of care was discussed  with  Daughter,  Husband   Disposition Plan:         To home once workup is complete and patient is stable   Following barriers for discharge:              Needs IV abx for CAP       Consults called: none    Admission status:  ED Disposition     ED Disposition  Admit   Condition  --   [place]: [place] [[id number]]  Level of Care: Med-Surg [16]  May admit patient to [facility] or [facility] if equivalent level of care is available:: Yes  Interfacility transfer: Yes  Covid Evaluation: Asymptomatic - no recent exposure (last 10 days) testing not required  Diagnosis: Community acquired pneumonia [[id number]]  Admitting Physician: Allie Bossier [[id number]]  Attending Physician: Allie Bossier [[id number]]  Certification:: I certify this patient will need inpatient services for at least 2 midnights  Estimated Length of Stay: 4          inpatient     I Expect 2  midnight stay secondary to severity of patient's current illness need for inpatient interventions justified by the following:   Severe lab/radiological/exam abnormalities including:    cAP   That are currently affecting medical management.   I expect  patient to be hospitalized for 2 midnights requiring inpatient medical care.  Patient is at high risk for adverse outcome (such as loss of life or disability) if not treated.  Indication for inpatient stay as follows:     Level of care      medical floor    Lab Results  Component Value Date   [place] 10/26/2022     Precautions: admitted as  Covid Negative  Stepen Prins 10/26/2022, 10:07 PM   [facility]     after 2 AM please page floor coverage PA If 7AM-7PM, please contact the day team taking care of the patient using [email or web address]   Patient was evaluated in the context of the global COVID-19 pandemic, which necessitated consideration that the patient might be at risk for infection with the SARS-CoV-2 virus that causes COVID-19. Institutional protocols and algorithms that pertain to the evaluation of patients at risk for COVID-19 are in a state of rapid change based on information released by regulatory bodies including the CDC and federal and state organizations. These policies and algorithms were followed during the patient's care.

## 2022-10-26 NOTE — Progress Notes (Signed)
Pharmacy Antibiotic Note  Julie Duran is a 77 y.o. female admitted on 10/26/2022 with sepsis, pneumonia.  Pharmacy has been consulted for Vancomycin and Cefepime dosing. Of note, pt recently had completed course of amox-clav 875/'125mg'$  BID (11/18 x10 days) and doxycycline 100 BID (11/20 x5 days)   Plan: Cefepime 2g IV q12h Vancomycin 1000 mg IV q12h  (SCr 0.92, est AUC 468)  Measure Vanc levels as needed.  Goal AUC = 400 - 550 Follow up renal function, culture results, and clinical course.   Height: '5\' 7"'$  (170.2 cm) Weight: 64 kg (141 lb 1.5 oz) IBW/kg (Calculated) : 61.6  Temp (24hrs), Avg:98 F (36.7 C), Min:97.8 F (36.6 C), Max:98.2 F (36.8 C)  Recent Labs  Lab 10/26/22 1237 10/26/22 1338  WBC 10.9*  --   CREATININE  --  0.92    Estimated Creatinine Clearance: 50.6 mL/min (by C-G formula based on SCr of 0.92 mg/dL).    Allergies  Allergen Reactions   Oxycodone-Acetaminophen     Antimicrobials this admission: 11/25 Vancomycin >>  11/25 Cefepime >>   Dose adjustments this admission:   Microbiology results: 11/25 Flu/COVID: neg 11/25 MRSA PCR:   Thank you for allowing pharmacy to be a part of this patient's care.  Gretta Arab PharmD, BCPS [facility] main pharmacy [phone] 10/26/2022 9:20 PM

## 2022-10-26 NOTE — Subjective & Objective (Signed)
Patient reports cough fever shortness of breath temperature up to 100.3 lasting for past 1 week shortness of breath worse with ambulation was recently diagnosed with pneumonia has finished a course of antibiotics Augmentin and was started on steroids recently doxycycline was added to her course but she still has a cough Cough productive of yellow to green sputum

## 2022-10-26 NOTE — ED Notes (Signed)
Pharmacy contacted, confirmed '1500mg'$  of vanc to start at same time. Compat with LR. Confirmed the dose and the way to draw up the maxipime.

## 2022-10-26 NOTE — Assessment & Plan Note (Signed)
- -  Patient presenting with  productive cough, fever    and infiltrate in   lower lobe on chest x-ray -Infiltrate on CXR and 2-3 characteristics (fever, leukocytosis, purulent sputum) are consistent with pneumonia. -This appears to be most likely community-acquired pneumonia failing outpatient treatment     will admit for treatment of CAP will start on appropriate antibiotic coverage. -Given that patient failed multiple courses of antibiotics will treat broadly with cefepime Vanco   Obtain:  sputum cultures,                  Obtain respiratory panel                    influenza serologies negative                  COVID PCR negative                     blood cultures and sputum cultures ordered                   strep pneumo UA antigen,                    check for Legionella antigen.                Provide oxygen as needed.

## 2022-10-26 NOTE — ED Notes (Signed)
Pt aware of the need for a sputum culture. Unable to at the moment.

## 2022-10-26 NOTE — ED Notes (Signed)
RT note: Pt. noted that mucus production has been less, breath sounds are b/l with good aeration t/o, scattered I/E loose/coarse wheezes and few insp.crackles >R/L, has been using Mucinex and Albuterol inhaler which both have helped with mucus amount being less.

## 2022-10-26 NOTE — Progress Notes (Signed)
Pharmacy Antibiotic Note  Julie Duran is a 76 y.o. female admitted on 10/26/2022 with pneumonia.  Pt has yellow/green mucus with SHOB. CXR today shows retrocardiac opacity with c/f pneumonia. Pharmacy has been consulted for vancomycin dosing. WBC 10.9/afeb  Of note, pt recently had completed course of amox-clav 875/'125mg'$  BID (11/18 x10 days) and doxycycline 100 BID (11/20 x5 days)  Plan: Vancomycin '1500mg'$  IV x1 as loading dose followed by vancomycin 500 mg q12h -vanc levels per protocol PRN -goal trough 15-11mg/mL F/u renal function, MRSA PCR, clinical picture/LOT Cefepime per MD   Height: '5\' 7"'$  (170.2 cm) Weight: 64 kg (141 lb 1.5 oz) IBW/kg (Calculated) : 61.6  Temp (24hrs), Avg:98 F (36.7 C), Min:98 F (36.7 C), Max:98 F (36.7 C)  Recent Labs  Lab 10/26/22 1237  WBC 10.9*    CrCl cannot be calculated (No successful lab value found.).    Allergies  Allergen Reactions   Oxycodone-Acetaminophen     Antimicrobials this admission: Vanc 11/25> Cefepime 11/25 x1  Dose adjustments this admission:   Microbiology results: 11/25 Flu/COVID: neg 11/25 MRSA PCR:   Thank you for allowing pharmacy to be a part of this patient's care.  MWilson Singer PharmD Clinical Pharmacist 10/26/2022 1:16 PM

## 2022-10-26 NOTE — ED Provider Notes (Signed)
[facility] EMERGENCY DEPT Provider Note   CSN: [id number] Arrival date & time: 10/26/22  1014     History  Chief Complaint  Patient presents with   Shortness of Breath   Fever    Julie Duran is a 76 y.o. female.   Shortness of Breath Associated symptoms: cough and fever   Fever Associated symptoms: cough      76 year old female presenting to the emergency department with concern for continued symptoms of pneumonia.  The patient states that her symptoms began 2 weeks ago.  She initially was seen at her PCPs walk-in clinic and was diagnosed with acute bronchitis.  She was started on a steroid and Augmentin.  She states that she then presented to urgent care a few days later and had a chest x-ray which confirmed a pneumonia and doxycycline was added to her outpatient regimen.  She continues to endorse a cough productive of mucus with associated shortness of breath.  She states that she had been initially feeling better then subsequently worsened last night and developed an elevated temperature at home.  She endorses dyspnea on exertion.  She completed a course of doxycycline and has 2 days left of Augmentin and is currently on steroids.  She states that she is coughing up yellow to now green mucus.  Home Medications Prior to Admission medications   Medication Sig Start Date End Date Taking? Authorizing Provider  Bioflavonoid Products (VITAMIN C PLUS) 1000 MG TABS Vitamin C 1,000 mg tablet  Take by oral route.    [provider]  calcium carbonate (OS-CAL) 600 MG TABS tablet calcium carbonate    [provider]  clonazePAM (KLONOPIN) 0.5 MG tablet clonazepam 0.5 mg tablet    [provider]  clonazePAM (KLONOPIN) 0.5 MG tablet  09/10/17   [provider]  cyclobenzaprine (FLEXERIL) 10 MG tablet cyclobenzaprine 10 mg tablet    [provider]  DULoxetine (CYMBALTA) 60 MG capsule  12/28/17   [provider]  ESTRACE VAGINAL 0.1 MG/GM vaginal cream  10/30/17   [provider]  estradiol (ESTRACE VAGINAL) 0.1 MG/GM vaginal cream Estrace 0.01% (0.1 mg/gram) vaginal cream  1/2 gram intravaginally 2 X week    [provider]  gabapentin (NEURONTIN) 100 MG capsule gabapentin 100 mg capsule    [provider]  Magnesium 500 MG CAPS magnesium    [provider]  Melatonin 1 MG CAPS Take 2 mg by mouth as needed.    [provider]  Multiple Vitamins-Minerals (MULTIVITAMIN ADULT EXTRA C PO) Multivitamin 50 Plus    [provider]  Polyethylene Glycol 3350 (MIRALAX PO) Miralax    [provider]  Probiotic Product (PROBIOTIC-10 PO) Probiotic    [provider]  traMADol (ULTRAM) 50 MG tablet Take 50 mg by mouth 2 (two) times daily. 12/28/17   [provider]      Allergies    Oxycodone-acetaminophen    Review of Systems   Review of Systems  Constitutional:  Positive for fever.  Respiratory:  Positive for cough and shortness of breath.   All other systems reviewed and are negative.   Physical Exam Updated Vital Signs BP (!) 128/56   Pulse 82   Temp 98.1 F (36.7 C) (Oral)   Resp 18   Ht '5\' 7"'$  (1.702 m)   Wt 64 kg   SpO2 95%   BMI 22.10 kg/m  Physical Exam Vitals and nursing note reviewed.  Constitutional:  General: She is not in acute distress.    Appearance: She is well-developed.  HENT:     Head: Normocephalic and atraumatic.  Eyes:     Conjunctiva/sclera: Conjunctivae normal.  Cardiovascular:     Rate and Rhythm: Normal rate and regular rhythm.     Heart sounds: No murmur heard. Pulmonary:     Effort: Pulmonary effort is normal. No respiratory distress.     Breath sounds: Examination of the right-upper field reveals wheezing. Examination of the left-upper field reveals wheezing. Examination of the right-middle field reveals wheezing. Examination of the left-middle field reveals wheezing and  rhonchi. Examination of the right-lower field reveals wheezing. Examination of the left-lower field reveals wheezing. Wheezing and rhonchi present.     Comments: Expiratory wheezes all lung fields, rhonchi most noticeable on the left Abdominal:     Palpations: Abdomen is soft.     Tenderness: There is no abdominal tenderness.  Musculoskeletal:        General: No swelling.     Cervical back: Neck supple.  Skin:    General: Skin is warm and dry.     Capillary Refill: Capillary refill takes less than 2 seconds.  Neurological:     Mental Status: She is alert.  Psychiatric:        Mood and Affect: Mood normal.     ED Results / Procedures / Treatments   Labs (all labs ordered are listed, but only abnormal results are displayed) Labs Reviewed  CBC WITH DIFFERENTIAL/PLATELET - Abnormal; Notable for the following components:      Result Value   WBC 10.9 (*)    Neutro Abs 9.2 (*)    Abs Immature Granulocytes 0.11 (*)    All other components within normal limits  BASIC METABOLIC PANEL - Abnormal; Notable for the following components:   Glucose, Bld 155 (*)    All other components within normal limits  RESP PANEL BY RT-PCR (FLU A&B, COVID) ARPGX2  EXPECTORATED SPUTUM ASSESSMENT W GRAM STAIN, RFLX TO RESP C  MRSA NEXT GEN BY PCR, NASAL  BRAIN NATRIURETIC PEPTIDE  MAGNESIUM  TROPONIN I (HIGH SENSITIVITY)    EKG EKG Interpretation  Date/Time:  Saturday October 26 2022 10:20:58 EST Ventricular Rate:  104 PR Interval:  134 QRS Duration: 84 QT Interval:  312 QTC Calculation: 410 R Axis:   96 Text Interpretation: Sinus tachycardia Right atrial enlargement Rightward axis Pulmonary disease pattern Nonspecific ST abnormality Abnormal ECG No previous ECGs available Confirmed by Regan Lemming ([id number]) on 10/26/2022 11:51:27 AM  Radiology CT Angio Chest PE W and/or Wo Contrast  Result Date: 10/26/2022 CLINICAL DATA:  Shortness of breath with cough and intermittent fever; PE suspected  EXAM: CT ANGIOGRAPHY CHEST WITH CONTRAST TECHNIQUE: Multidetector CT imaging of the chest was performed using the standard protocol during bolus administration of intravenous contrast. Multiplanar CT image reconstructions and MIPs were obtained to evaluate the vascular anatomy. RADIATION DOSE REDUCTION: This exam was performed according to the departmental dose-optimization program which includes automated exposure control, adjustment of the mA and/or kV according to patient size and/or use of iterative reconstruction technique. CONTRAST:  133m OMNIPAQUE IOHEXOL 350 MG/ML SOLN COMPARISON:  Radiographs earlier today FINDINGS: Cardiovascular: Satisfactory opacification of the pulmonary arteries to the segmental level. No evidence of pulmonary embolism. Mild coronary artery atherosclerotic calcification. Normal heart size. No pericardial effusion. Aortic calcification. Mediastinum/Nodes: No enlarged mediastinal, hilar, or axillary lymph nodes. Small hiatal hernia. Lungs/Pleura: Bronchial wall thickening with mucous plugging in the left lower lobe.  Associated peribronchovascular consolidation and multiple centrilobular micro nodules in the left lower lobe and to a lesser extent in the lingula. 4 mm nodule in the posterior right lower lobe (9/91). Upper Abdomen: No acute abnormality. Musculoskeletal: No chest wall abnormality. No acute osseous findings. Review of the MIP images confirms the above findings. IMPRESSION: Left lower lobe bronchopneumonia. Follow-up in 4-6 weeks after treatment is recommended to exclude underlying mass. Negative for pulmonary embolism. Aortic Atherosclerosis (ICD10-I70.0). Electronically Signed   By: Placido Sou M.D.   On: 10/26/2022 15:44   DG Chest 2 View  Result Date: 10/26/2022 CLINICAL DATA:  Shortness of breath, cough, intermittent fever. EXAM: CHEST - 2 VIEW COMPARISON:  Chest radiograph 05/24/2009 FINDINGS: The cardiomediastinal silhouette is normal. There is retrocardiac  opacity best seen on the lateral projection. There is no other focal airspace disease. There is no pulmonary edema. There is no pleural effusion or pneumothorax There is no acute osseous abnormality. IMPRESSION: Retrocardiac opacity suspicious for pneumonia. Recommend follow-up two-view chest radiographs in 3-4 weeks to ensure resolution. Electronically Signed   By: Valetta Mole M.D.   On: 10/26/2022 11:05    Procedures Procedures    Medications Ordered in ED Medications  albuterol (VENTOLIN HFA) 108 (90 Base) MCG/ACT inhaler 2 puff (has no administration in time range)  vancomycin (VANCOCIN) 500 mg in sodium chloride 0.9 % 100 mL IVPB (has no administration in time range)  ceFEPIme (MAXIPIME) 1 g in sodium chloride 0.9 % 100 mL IVPB (0 g Intravenous Stopped 10/26/22 1600)  lactated ringers bolus 1,000 mL (0 mLs Intravenous Stopped 10/26/22 1600)  vancomycin (VANCOCIN) IVPB 1000 mg/200 mL premix (0 mg Intravenous Stopped 10/26/22 1617)    Followed by  vancomycin (VANCOCIN) 500 mg in sodium chloride 0.9 % 100 mL IVPB (0 mg Intravenous Stopped 10/26/22 1617)  iohexol (OMNIPAQUE) 350 MG/ML injection 100 mL (100 mLs Intravenous Contrast Given 10/26/22 1519)    ED Course/ Medical Decision Making/ A&P Clinical Course as of 10/26/22 1853  Sat Oct 26, 2022  1355 WBC(!): 10.9 [JL]    Clinical Course User Index [JL] Regan Lemming, MD                           Medical Decision Making Amount and/or Complexity of Data Reviewed Labs: ordered. Decision-making details documented in ED Course. Radiology: ordered.  Risk Prescription drug management. Decision regarding hospitalization.    76 year old female presenting to the emergency department with concern for continued symptoms of pneumonia.  The patient states that her symptoms began 2 weeks ago.  She initially was seen at her PCPs walk-in clinic and was diagnosed with acute bronchitis.  She was started on a steroid and Augmentin.  She states  that she then presented to urgent care a few days later and had a chest x-ray which confirmed a pneumonia and doxycycline was added to her outpatient regimen.  She continues to endorse a cough productive of mucus with associated shortness of breath.  She states that she had been initially feeling better then subsequently worsened last night and developed an elevated temperature at home.  She endorses dyspnea on exertion.  She completed a course of doxycycline and has 2 days left of Augmentin and is currently on steroids.  She states that she is coughing up yellow to now green mucus.  On arrival, the patient was afebrile, temperature 98, tachycardic to 107, not tachypneic RR 20, BP 118/72, saturating 95% on room air.  Sinus rhythm noted on cardiac telemetry on my evaluation.  Physical exam significant for expiratory wheezes in all lung fields, rhonchi noted most prominent on the left.  The patient was otherwise well-appearing.  Presenting with symptomatic pneumonia despite outpatient antibiotics, worsening symptoms in the last day with new recurrence of fever.  Laboratory evaluation performed significant for mild leukocytosis 10.9, COVID-19 influenza PCR testing negative, troponin negative, BNP normal, BMP unremarkable, magnesium normal at 2.4.  BNP collected due to the patient's dyspnea.  The differential diagnosis includes PE, pneumonia, pleural effusion, empyema, lung abscess.  Chest x-ray was performed which revealed a retrocardiac opacity suspicious for pneumonia.  A CT angiogram was performed which revealed a left lower lobe bronchopneumonia with no evidence for PE.  Given the patient's failure of outpatient management on doxycycline and Augmentin, persistent symptoms with worsening of symptoms in the last day, hospitalist medicine was consulted for admission.  The patient was started on broad-spectrum antibiotics include IV vancomycin and cefepime.  Plan at time of signout to follow-up hospitalist  consultation for admission.  Signout given to Dr. Melina Copa at 1600.   Final Clinical Impression(s) / ED Diagnoses Final diagnoses:  Community acquired pneumonia of left lung, unspecified part of lung    Rx / DC Orders ED Discharge Orders     None         Regan Lemming, MD 10/26/22 [phone]

## 2022-10-26 NOTE — ED Notes (Signed)
RT note: Pt. seen in triage-1 with assessment done, has inhaler that was obtained from prior visit and has been using, coughing up some yellow to now green mucus, able to speak in full sentences with v/s WNL, per RT Protocol would consider Duoneb('5mg'$ /0.5 mg) with Flutter administration. RT to monitor.

## 2022-10-26 NOTE — ED Provider Notes (Signed)
Signout from Dr. Kellie Simmering.  76 year old female with continued cough and fevers for pneumonia treatment despite being on a course of doxycycline and Augmentin.  Has evidence of pneumonia here on imaging.  Has received IV antibiotics. Physical Exam  BP (!) 128/56   Pulse 82   Temp 98.1 F (36.7 C) (Oral)   Resp 18   Ht '5\' 7"'$  (1.702 m)   Wt 64 kg   SpO2 95%   BMI 22.10 kg/m   Physical Exam  Procedures  Procedures  ED Course / MDM   Clinical Course as of 10/26/22 1748  Sat Oct 26, 2022  1355 WBC(!): 10.9 [JL]    Clinical Course User Index [JL] Regan Lemming, MD   Medical Decision Making Amount and/or Complexity of Data Reviewed Labs: ordered. Decision-making details documented in ED Course. Radiology: ordered.  Risk Prescription drug management. Decision regarding hospitalization.   Plan is to discuss with hospitalist regarding admission.  Discussed with Dr. Sherral Hammers [facility] hospitalist who is excepting the patient for admission.       Hayden Rasmussen, MD 10/27/22 [phone]

## 2022-10-26 NOTE — Assessment & Plan Note (Signed)
-     Will initiate: Steroid taper - Albuterol  RN, - scheduled duoneb,   Titrate O2 to saturation >90%. Follow patients respiratory status.   nfluenza PCR neg  VBG ordered    Currently mentating well no evidence of symptomatic hypercarbia

## 2022-10-26 NOTE — ED Notes (Signed)
MRSA PCR not performed at current facility.

## 2022-10-27 DIAGNOSIS — F419 Anxiety disorder, unspecified: Secondary | ICD-10-CM | POA: Diagnosis not present

## 2022-10-27 DIAGNOSIS — M797 Fibromyalgia: Secondary | ICD-10-CM

## 2022-10-27 DIAGNOSIS — J189 Pneumonia, unspecified organism: Secondary | ICD-10-CM | POA: Diagnosis not present

## 2022-10-27 LAB — COMPREHENSIVE METABOLIC PANEL
ALT: 39 U/L (ref 0–44)
ALT: 36 U/L (ref 0–44)
AST: 38 U/L (ref 15–41)
AST: 35 U/L (ref 15–41)
Albumin: 3.7 g/dL (ref 3.5–5.0)
Albumin: 3 g/dL — ABNORMAL LOW (ref 3.5–5.0)
Alkaline Phosphatase: 91 U/L (ref 38–126)
Alkaline Phosphatase: 77 U/L (ref 38–126)
Anion gap: 16 — ABNORMAL HIGH (ref 5–15)
Anion gap: 9 (ref 5–15)
BUN: 20 mg/dL (ref 8–23)
BUN: 20 mg/dL (ref 8–23)
CO2: 16 mmol/L — ABNORMAL LOW (ref 22–32)
CO2: 23 mmol/L (ref 22–32)
Calcium: 9.5 mg/dL (ref 8.9–10.3)
Calcium: 8.6 mg/dL — ABNORMAL LOW (ref 8.9–10.3)
Chloride: 110 mmol/L (ref 98–111)
Chloride: 108 mmol/L (ref 98–111)
Creatinine, Ser: 0.74 mg/dL (ref 0.44–1.00)
Creatinine, Ser: 0.79 mg/dL (ref 0.44–1.00)
GFR, Estimated: >60 mL/min (ref >60)
GFR, Estimated: >60 mL/min (ref >60)
Glucose, Bld: 93 mg/dL (ref 70–99)
Glucose, Bld: 95 mg/dL (ref 70–99)
Potassium: 4.7 mmol/L (ref 3.5–5.1)
Potassium: 4 mmol/L (ref 3.5–5.1)
Sodium: 142 mmol/L (ref 135–145)
Sodium: 140 mmol/L (ref 135–145)
Total Bilirubin: 0.3 mg/dL (ref 0.3–1.2)
Total Bilirubin: 0.5 mg/dL (ref 0.3–1.2)
Total Protein: 7.4 g/dL (ref 6.5–8.1)
Total Protein: 6.3 g/dL — ABNORMAL LOW (ref 6.5–8.1)

## 2022-10-27 LAB — RESPIRATORY PANEL BY PCR

## 2022-10-27 LAB — URINALYSIS, ROUTINE W REFLEX MICROSCOPIC
Bacteria, UA: NONE SEEN
Bilirubin Urine: NEGATIVE
Glucose, UA: NEGATIVE mg/dL
Hgb urine dipstick: NEGATIVE
Ketones, ur: NEGATIVE mg/dL
Leukocytes,Ua: NEGATIVE
Nitrite: NEGATIVE
Protein, ur: NEGATIVE mg/dL
Specific Gravity, Urine: 1.004 — ABNORMAL LOW (ref 1.005–1.030)
pH: 7 (ref 5.0–8.0)

## 2022-10-27 LAB — CBC
HCT: 39.7 % (ref 36.0–46.0)
Hemoglobin: 13.3 g/dL (ref 12.0–15.0)
MCH: 32.1 pg (ref 26.0–34.0)
MCHC: 33.5 g/dL (ref 30.0–36.0)
MCV: 95.9 fL (ref 80.0–100.0)
Platelets: 299 10*3/uL (ref 150–400)
RBC: 4.14 MIL/uL (ref 3.87–5.11)
RDW: 12.4 % (ref 11.5–15.5)
WBC: 12 10*3/uL — ABNORMAL HIGH (ref 4.0–10.5)
nRBC: 0 % (ref 0.0–0.2)

## 2022-10-27 LAB — STREP PNEUMONIAE URINARY ANTIGEN: Strep Pneumo Urinary Antigen: NEGATIVE

## 2022-10-27 LAB — EXPECTORATED SPUTUM ASSESSMENT W GRAM STAIN, RFLX TO RESP C

## 2022-10-27 LAB — HIV ANTIBODY (ROUTINE TESTING W REFLEX): HIV Screen 4th Generation wRfx: NONREACTIVE

## 2022-10-27 LAB — LACTIC ACID, PLASMA
Lactic Acid, Venous: 1 mmol/L (ref 0.5–1.9)
Lactic Acid, Venous: 1.3 mmol/L (ref 0.5–1.9)

## 2022-10-27 LAB — MRSA NEXT GEN BY PCR, NASAL: MRSA by PCR Next Gen: NOT DETECTED

## 2022-10-27 LAB — PROCALCITONIN: Procalcitonin: <0.1 ng/mL

## 2022-10-27 LAB — MAGNESIUM: Magnesium: 2.2 mg/dL (ref 1.7–2.4)

## 2022-10-27 LAB — PHOSPHORUS: Phosphorus: 3.5 mg/dL (ref 2.5–4.6)

## 2022-10-27 LAB — PREALBUMIN: Prealbumin: 20 mg/dL (ref 18–38)

## 2022-10-27 LAB — TSH: TSH: 1.765 u[IU]/mL (ref 0.350–4.500)

## 2022-10-27 MED ORDER — DEXTROMETHORPHAN POLISTIREX ER 30 MG/5ML PO SUER: 30.0000 mg | Freq: Every evening | ORAL | Status: DC | PRN

## 2022-10-27 MED ORDER — VITAMIN C 500 MG PO TABS
1000.0000 mg | ORAL_TABLET | Freq: Every day | ORAL | Status: DC
  Administered 2022-10-27 – 2022-10-28 (×2): 1000 mg via ORAL
  Filled 2022-10-27 (×2): qty 2

## 2022-10-27 MED ORDER — SODIUM CHLORIDE 0.9 % IV BOLUS
500.0000 mL | Freq: Once | INTRAVENOUS | Status: AC
  Administered 2022-10-27: 500 mL via INTRAVENOUS

## 2022-10-27 MED ORDER — IPRATROPIUM-ALBUTEROL 0.5-2.5 (3) MG/3ML IN SOLN
3.0000 mL | Freq: Two times a day (BID) | RESPIRATORY_TRACT | Status: DC
  Administered 2022-10-27 – 2022-10-28 (×2): 3 mL via RESPIRATORY_TRACT
  Filled 2022-10-27 (×2): qty 3

## 2022-10-27 MED ORDER — PREDNISONE 20 MG PO TABS
40.0000 mg | ORAL_TABLET | Freq: Every day | ORAL | Status: DC
  Administered 2022-10-28: 40 mg via ORAL
  Filled 2022-10-27: qty 2

## 2022-10-27 MED ORDER — PRAVASTATIN SODIUM 20 MG PO TABS
10.0000 mg | ORAL_TABLET | Freq: Every day | ORAL | Status: AC
  Administered 2022-10-27: 10 mg via ORAL
  Filled 2022-10-27: qty 1

## 2022-10-27 MED ORDER — ORAL CARE MOUTH RINSE: 15.0000 mL | OROMUCOSAL | Status: DC | PRN

## 2022-10-27 NOTE — Evaluation (Signed)
Physical Therapy Evaluation Patient Details Name: Julie Duran MRN: [id number] DOB: Aug 07, 1946 Today's Date: 10/27/2022  History of Present Illness  Patient is a 76 year old female who presented with fever, wheezing and SOB. Patient was fount to have CAP, sepsis, and reactive airway disease. PMH: bronchitis, depression, fibromyalgia, hand surgery  Clinical Impression  Pt admitted as above and presenting with functional mobility limitations 2* mild ambulatory balance deficits and decreased endurance - pt with noted SOB with ambulation on RA requiring rest breaks but O2 sats 93-98% and max HR at 95 BPM.  Pt should progress to dc home with family assist.     Recommendations for follow up therapy are one component of a multi-disciplinary discharge planning process, led by the attending physician.  Recommendations may be updated based on patient status, additional functional criteria and insurance authorization.  Follow Up Recommendations No PT follow up      Assistance Recommended at Discharge PRN  Patient can return home with the following  Assistance with cooking/housework;Assist for transportation;Help with stairs or ramp for entrance    Equipment Recommendations None recommended by PT  Recommendations for Other Services       Functional Status Assessment Patient has had a recent decline in their functional status and demonstrates the ability to make significant improvements in function in a reasonable and predictable amount of time.     Precautions / Restrictions Precautions Precaution Comments: monitor o2 Restrictions Weight Bearing Restrictions: No      Mobility  Bed Mobility Overal bed mobility: Modified Independent             General bed mobility comments: no physical assist    Transfers Overall transfer level: Needs assistance Equipment used: Rolling walker (2 wheels) Transfers: Sit to/from Stand Sit to Stand: Min guard, Supervision            General transfer comment: Steady assist only    Ambulation/Gait Ambulation/Gait assistance: Min guard, Supervision Gait Distance (Feet): 450 Feet Assistive device: None Gait Pattern/deviations: WFL(Within Functional Limits)       General Gait Details: mild instability but no LOB; several standing rest breaks requried 2* SOB but pt did not Teacher, music    Modified Rankin (Stroke Patients Only)       Balance Overall balance assessment: Mild deficits observed, not formally tested                                           Pertinent Vitals/Pain Pain Assessment Pain Assessment: 0-10 Pain Score: 3  Pain Location: L lower quadrant Pain Descriptors / Indicators: Other (Comment) Pain Intervention(s): Limited activity within patient's tolerance, Monitored during session    [place] expects to be discharged to:: Private residence Living Arrangements: Spouse/significant other Available Help at Discharge: Family;Available 24 hours/day Type of Home: House           Home Equipment: None      Prior Function Prior Level of Function : Independent/Modified Independent               ADLs Comments: retired Nutritional therapist        Extremity/Trunk Assessment   Upper Extremity Assessment Upper Extremity Assessment: Overall WFL for tasks assessed    Lower Extremity Assessment Lower Extremity Assessment: Overall WFL for  tasks assessed    Cervical / Trunk Assessment Cervical / Trunk Assessment: Normal  Communication   Communication: No difficulties  Cognition Arousal/Alertness: Awake/alert Behavior During Therapy: WFL for tasks assessed/performed Overall Cognitive Status: Within Functional Limits for tasks assessed                                 General Comments: patients two daughters and husband were present during session as well        General Comments       Exercises     Assessment/Plan    PT Assessment Patient needs continued PT services  PT Problem List Decreased strength;Decreased range of motion;Decreased activity tolerance;Decreased balance       PT Treatment Interventions DME instruction;Gait training;Stair training;Functional mobility training;Therapeutic activities;Therapeutic exercise;Patient/family education    PT Goals (Current goals can be found in the Care Plan section)  Acute Rehab PT Goals Patient Stated Goal: REgain full IND PT Goal Formulation: With patient Time For Goal Achievement: 11/10/22 Potential to Achieve Goals: Good    Frequency Min 3X/week     Co-evaluation               AM-PAC PT "6 Clicks" Mobility  Outcome Measure Help needed turning from your back to your side while in a flat bed without using bedrails?: None Help needed moving from lying on your back to sitting on the side of a flat bed without using bedrails?: None Help needed moving to and from a bed to a chair (including a wheelchair)?: A Little Help needed standing up from a chair using your arms (e.g., wheelchair or bedside chair)?: A Little Help needed to walk in hospital room?: A Little Help needed climbing 3-5 steps with a railing? : A Little 6 Click Score: 20    End of Session Equipment Utilized During Treatment: Gait belt Activity Tolerance: Patient tolerated treatment well Patient left: in bed;with call bell/phone within reach;with family/visitor present Nurse Communication: Mobility status PT Visit Diagnosis: Difficulty in walking, not elsewhere classified (R26.2)    Time: 8891-6945 PT Time Calculation (min) (ACUTE ONLY): 29 min   Charges:   PT Evaluation $PT Eval Low Complexity: [place] Pager [phone] Office [phone]   Velinda Wrobel 10/27/2022, 4:46 PM

## 2022-10-27 NOTE — Progress Notes (Signed)
PROGRESS NOTE    Julie Duran  KCM:[id number] DOB: 02-25-1946 DOA: 10/26/2022 PCP: Carol Ada, MD    Brief Narrative:   76 year old female with continued cough and fevers for pneumonia treatment despite being on a course of doxycycline and Augmentin.  Has evidence of pneumonia here on imaging.   Assessment and Plan: Sepsis due to Community acquired pneumonia -Patient presenting with  productive cough, fever    and infiltrate in   lower lobe on chest x-ray -Infiltrate on CXR and 2-3 characteristics (fever, leukocytosis, purulent sputum) are consistent with pneumonia. - community-acquired pneumonia failing outpatient treatment  - IV abx -sputum cx pending -NP swab pending- suspect she will have a viral component since her pro calcitonin is negative  -flu/COVID negative  -nebs -steroids -pulmonary toilet  Fibromyalgia -cymbalta  Anxiety -klonopin       DVT prophylaxis: enoxaparin (LOVENOX) injection 40 mg Start: 10/26/22 2200    Code Status: Full Code Family Communication: at bedside  Disposition Plan:  Level of care: Med-Surg Status is: Inpatient Remains inpatient appropriate because: needs IV abx    Consultants:  none   Subjective: Did not sleep well last PM  Objective: Vitals:   10/27/22 0157 10/27/22 0452 10/27/22 0700 10/27/22 0746  BP:  (!) 144/69    Pulse:  79    Resp:  17    Temp:  99.4 F (37.4 C)    TempSrc:      SpO2: 93% 94%  92%  Weight:   65 kg   Height:        Intake/Output Summary (Last 24 hours) at 10/27/2022 0925 Last data filed at 10/27/2022 0000 Gross per 24 hour  Intake 281.73 ml  Output 800 ml  Net -518.27 ml   Filed Weights   10/26/22 1029 10/27/22 0700  Weight: 64 kg 65 kg    Examination:   General: Appearance:    Well developed, well nourished female in no acute distress     Lungs:     Diminished throughout, no wheezing, respirations unlabored  Heart:    Normal heart rate.    MS:   All  extremities are intact.    Neurologic:   Awake, alert       Data Reviewed: I have personally reviewed following labs and imaging studies  CBC: Recent Labs  Lab 10/26/22 1237 10/27/22 0125  WBC 10.9* 12.0*  NEUTROABS 9.2*  --   HGB 14.2 13.3  HCT 42.8 39.7  MCV 93.7 95.9  PLT 350 056   Basic Metabolic Panel: Recent Labs  Lab 10/26/22 1338 10/26/22 2208 10/27/22 0125  NA 138 142 140  K 4.0 4.7 4.0  CL 101 110 108  CO2 25 16* 23  GLUCOSE 155* 93 95  BUN '17 20 20  '$ CREATININE 0.92 0.74 0.79  CALCIUM 9.1 9.5 8.6*  MG 2.4  --  2.2  PHOS  --  3.2 3.5   GFR: Estimated Creatinine Clearance: 58.2 mL/min (by C-G formula based on SCr of 0.79 mg/dL). Liver Function Tests: Recent Labs  Lab 10/26/22 2208 10/27/22 0125  AST 38 35  ALT 39 36  ALKPHOS 91 77  BILITOT 0.3 0.5  PROT 7.4 6.3*  ALBUMIN 3.7 3.0*   No results for input(s): "LIPASE", "AMYLASE" in the last 168 hours. No results for input(s): "AMMONIA" in the last 168 hours. Coagulation Profile: Recent Labs  Lab 10/26/22 2208  INR 1.0   Cardiac Enzymes: Recent Labs  Lab 10/26/22 2208  CKTOTAL 65  BNP (last 3 results) No results for input(s): "PROBNP" in the last 8760 hours. HbA1C: No results for input(s): "HGBA1C" in the last 72 hours. CBG: No results for input(s): "GLUCAP" in the last 168 hours. Lipid Profile: No results for input(s): "CHOL", "HDL", "LDLCALC", "TRIG", "CHOLHDL", "LDLDIRECT" in the last 72 hours. Thyroid Function Tests: Recent Labs    10/26/22 2208  TSH 1.765   Anemia Panel: No results for input(s): "VITAMINB12", "FOLATE", "FERRITIN", "TIBC", "IRON", "RETICCTPCT" in the last 72 hours. Sepsis Labs: Recent Labs  Lab 10/26/22 2208 10/27/22 0442 10/27/22 0649  PROCALCITON <0.10  --   --   LATICACIDVEN 2.2* 1.0 1.3    Recent Results (from the past 240 hour(s))  Resp Panel by RT-PCR (Flu A&B, Covid) Anterior Nasal Swab     Status: None   Collection Time: 10/26/22 10:31 AM    Specimen: Anterior Nasal Swab  Result Value Ref Range Status   SARS Coronavirus 2 by RT PCR NEGATIVE NEGATIVE Final    Comment: (NOTE) SARS-CoV-2 target nucleic acids are NOT DETECTED.  The SARS-CoV-2 RNA is generally detectable in upper respiratory specimens during the acute phase of infection. The lowest concentration of SARS-CoV-2 viral copies this assay can detect is 138 copies/mL. A negative result does not preclude SARS-Cov-2 infection and should not be used as the sole basis for treatment or other patient management decisions. A negative result may occur with  improper specimen collection/handling, submission of specimen other than nasopharyngeal swab, presence of viral mutation(s) within the areas targeted by this assay, and inadequate number of viral copies(<138 copies/mL). A negative result must be combined with clinical observations, patient history, and epidemiological information. The expected result is Negative.  Fact Sheet for Patients:  EntrepreneurPulse.com.au  Fact Sheet for Healthcare Providers:  IncredibleEmployment.be  This test is no t yet approved or cleared by the [place] FDA and  has been authorized for detection and/or diagnosis of SARS-CoV-2 by FDA under an Emergency Use Authorization (EUA). This EUA will remain  in effect (meaning this test can be used) for the duration of the COVID-19 declaration under Section 564(b)(1) of the Act, 21 U.S.C.section 360bbb-3(b)(1), unless the authorization is terminated  or revoked sooner.       Influenza A by PCR NEGATIVE NEGATIVE Final   Influenza B by PCR NEGATIVE NEGATIVE Final    Comment: (NOTE) The Xpert Xpress SARS-CoV-2/FLU/RSV plus assay is intended as an aid in the diagnosis of influenza from Nasopharyngeal swab specimens and should not be used as a sole basis for treatment. Nasal washings and aspirates are unacceptable for Xpert Xpress  SARS-CoV-2/FLU/RSV testing.  Fact Sheet for Patients: EntrepreneurPulse.com.au  Fact Sheet for Healthcare Providers: IncredibleEmployment.be  This test is not yet approved or cleared by the [place] FDA and has been authorized for detection and/or diagnosis of SARS-CoV-2 by FDA under an Emergency Use Authorization (EUA). This EUA will remain in effect (meaning this test can be used) for the duration of the COVID-19 declaration under Section 564(b)(1) of the Act, 21 U.S.C. section 360bbb-3(b)(1), unless the authorization is terminated or revoked.  Performed at [facility], [address], [place], [place] [address]   MRSA Next Gen by PCR, Nasal     Status: None   Collection Time: 10/27/22 12:32 AM   Specimen: Nasal Mucosa; Nasal Swab  Result Value Ref Range Status   MRSA by PCR Next Gen NOT DETECTED NOT DETECTED Final    Comment: (NOTE) The GeneXpert MRSA Assay (FDA approved for NASAL  specimens only), is one component of a comprehensive MRSA colonization surveillance program. It is not intended to diagnose MRSA infection nor to guide or monitor treatment for MRSA infections. Test performance is not FDA approved in patients less than 28 years old. Performed at [facility], [place] [address], [place], [place] [address]   Expectorated Sputum Assessment w Gram Stain, Rflx to Resp Cult     Status: None   Collection Time: 10/27/22  3:58 AM   Specimen: Expectorated Sputum  Result Value Ref Range Status   Specimen Description EXPECTORATED SPUTUM  Final   Special Requests NONE  Final   Sputum evaluation   Final    THIS SPECIMEN IS ACCEPTABLE FOR SPUTUM CULTURE Performed at [facility], [place] [address] [place], [place] [address]    Report Status 10/27/2022 FINAL  Final         Radiology Studies: CT Angio Chest PE W and/or Wo Contrast  Result Date: 10/26/2022 CLINICAL DATA:   Shortness of breath with cough and intermittent fever; PE suspected EXAM: CT ANGIOGRAPHY CHEST WITH CONTRAST TECHNIQUE: Multidetector CT imaging of the chest was performed using the standard protocol during bolus administration of intravenous contrast. Multiplanar CT image reconstructions and MIPs were obtained to evaluate the vascular anatomy. RADIATION DOSE REDUCTION: This exam was performed according to the departmental dose-optimization program which includes automated exposure control, adjustment of the mA and/or kV according to patient size and/or use of iterative reconstruction technique. CONTRAST:  166m OMNIPAQUE IOHEXOL 350 MG/ML SOLN COMPARISON:  Radiographs earlier today FINDINGS: Cardiovascular: Satisfactory opacification of the pulmonary arteries to the segmental level. No evidence of pulmonary embolism. Mild coronary artery atherosclerotic calcification. Normal heart size. No pericardial effusion. Aortic calcification. Mediastinum/Nodes: No enlarged mediastinal, hilar, or axillary lymph nodes. Small hiatal hernia. Lungs/Pleura: Bronchial wall thickening with mucous plugging in the left lower lobe. Associated peribronchovascular consolidation and multiple centrilobular micro nodules in the left lower lobe and to a lesser extent in the lingula. 4 mm nodule in the posterior right lower lobe (9/91). Upper Abdomen: No acute abnormality. Musculoskeletal: No chest wall abnormality. No acute osseous findings. Review of the MIP images confirms the above findings. IMPRESSION: Left lower lobe bronchopneumonia. Follow-up in 4-6 weeks after treatment is recommended to exclude underlying mass. Negative for pulmonary embolism. Aortic Atherosclerosis (ICD10-I70.0). Electronically Signed   By: TPlacido SouM.D.   On: 10/26/2022 15:44   DG Chest 2 View  Result Date: 10/26/2022 CLINICAL DATA:  Shortness of breath, cough, intermittent fever. EXAM: CHEST - 2 VIEW COMPARISON:  Chest radiograph 05/24/2009 FINDINGS:  The cardiomediastinal silhouette is normal. There is retrocardiac opacity best seen on the lateral projection. There is no other focal airspace disease. There is no pulmonary edema. There is no pleural effusion or pneumothorax There is no acute osseous abnormality. IMPRESSION: Retrocardiac opacity suspicious for pneumonia. Recommend follow-up two-view chest radiographs in 3-4 weeks to ensure resolution. Electronically Signed   By: PValetta MoleM.D.   On: 10/26/2022 11:05        Scheduled Meds:  clonazePAM  0.5 mg Oral QHS   DULoxetine  60 mg Oral Daily   enoxaparin (LOVENOX) injection  40 mg Subcutaneous Q24H   guaiFENesin  600 mg Oral BID   ipratropium-albuterol  3 mL Nebulization Q6H   predniSONE  40 mg Oral Q breakfast   sodium chloride flush  3 mL Intravenous Q12H   traMADol  50 mg Oral BID   Continuous Infusions:  ceFEPime (MAXIPIME) IV  2 g (10/26/22 2354)   vancomycin       LOS: 1 day    Time spent: 45  minutes spent on chart review, discussion with nursing staff, consultants, updating family and interview/physical exam; more than 50% of that time was spent in counseling and/or coordination of care.    Geradine Girt, DO [facility] Available via Epic secure chat 7am-7pm After these hours, please refer to coverage provider listed on [email or web address] 10/27/2022, 9:25 AM

## 2022-10-27 NOTE — Evaluation (Signed)
Occupational Therapy Evaluation Patient Details Name: Julie Duran MRN: [id number] DOB: Jan 21, 1946 Today's Date: 10/27/2022   History of Present Illness Patient is a 76 year old female who presented with fever, wheezing and SOB. Patient was fount to have CAP, sepsis, and reactive airway disease. PMH: bronchitis, depression, fibromyalgia, hand surgery   Clinical Impression   Patient evaluated by Occupational Therapy with no further acute OT needs identified. All education has been completed and the patient has no further questions. Patient is MI for ADLs and will have family support at time of d/c. Patient endorsed not needing skilled OT services on this date.  See below for any follow-up Occupational Therapy or equipment needs. OT is signing off. Thank you for this referral.       Recommendations for follow up therapy are one component of a multi-disciplinary discharge planning process, led by the attending physician.  Recommendations may be updated based on patient status, additional functional criteria and insurance authorization.   Follow Up Recommendations  No OT follow up     Assistance Recommended at Discharge Set up Supervision/Assistance  Patient can return home with the following Assistance with cooking/housework;Assist for transportation    Functional Status Assessment  Patient has not had a recent decline in their functional status  Equipment Recommendations       Recommendations for Other Services       Precautions / Restrictions Precautions Precaution Comments: monitor o2 Restrictions Weight Bearing Restrictions: No      Mobility Bed Mobility Overal bed mobility: Modified Independent                  Transfers                          Balance Overall balance assessment: No apparent balance deficits (not formally assessed)                                         ADL either performed or assessed with clinical  judgement   ADL Overall ADL's : Modified independent                                       General ADL Comments: patient is able to complete ADLs at baseline with patient reporting increased SOB. patient completed transfers, bed mobility, LB dressing, and functional mobility with supervision/MI needed increased A for line managemnet. if patient was not managing IV pole patient would require less A. patient was educated on ECT transitioning home. patient verbalized understanding. patient endorsed not needing skilled OT services at this time. patient to transition home with family support at time of d/c.     Vision Patient Visual Report: No change from baseline       Perception     Praxis      Pertinent Vitals/Pain Pain Assessment Pain Assessment: 0-10 Pain Score: 3  Pain Location: L lower quadrant Pain Descriptors / Indicators: Other (Comment) (reported feeling like she "pulled something" from coughing. nurse made aware.) Pain Intervention(s): Limited activity within patient's tolerance, Monitored during session     Hand Dominance     Extremity/Trunk Assessment Upper Extremity Assessment Upper Extremity Assessment: Overall WFL for tasks assessed   Lower Extremity Assessment Lower Extremity Assessment: Defer to PT evaluation   Cervical /  Trunk Assessment Cervical / Trunk Assessment: Normal   Communication Communication Communication: No difficulties   Cognition Arousal/Alertness: Awake/alert Behavior During Therapy: WFL for tasks assessed/performed Overall Cognitive Status: Within Functional Limits for tasks assessed                                 General Comments: patients two daughters and husband were present during session as well     General Comments       Exercises     Shoulder Instructions      Home Living Family/patient expects to be discharged to:: Private residence Living Arrangements: Spouse/significant other Available  Help at Discharge: Family;Available 24 hours/day Type of Home: House                       Home Equipment: None          Prior Functioning/Environment Prior Level of Function : Independent/Modified Independent               ADLs Comments: retirned nurse        OT Problem List:        OT Treatment/Interventions:      OT Goals(Current goals can be found in the care plan section) Acute Rehab OT Goals Patient Stated Goal: to go home OT Goal Formulation: All assessment and education complete, DC therapy  OT Frequency:      Co-evaluation              AM-PAC OT "6 Clicks" Daily Activity     Outcome Measure Help from another person eating meals?: None Help from another person taking care of personal grooming?: None Help from another person toileting, which includes using toliet, bedpan, or urinal?: None Help from another person bathing (including washing, rinsing, drying)?: None Help from another person to put on and taking off regular upper body clothing?: None Help from another person to put on and taking off regular lower body clothing?: None 6 Click Score: 24   End of Session Equipment Utilized During Treatment: Gait belt;Rolling walker (2 wheels) Nurse Communication: Other (comment) (ok to participate in session)  Activity Tolerance: Patient tolerated treatment well Patient left: in bed;with call bell/phone within reach;with family/visitor present  OT Visit Diagnosis: Unsteadiness on feet (R26.81)                Time: 7048-8891 OT Time Calculation (min): 31 min Charges:  OT General Charges $OT Visit: 1 Visit OT Evaluation $OT Eval Low Complexity: 1 Low  Julie Duran OTR/L, MS Acute Rehabilitation Department Office# [phone]   Willa Rough 10/27/2022, 3:49 PM

## 2022-10-28 DIAGNOSIS — J189 Pneumonia, unspecified organism: Secondary | ICD-10-CM | POA: Diagnosis not present

## 2022-10-28 LAB — LEGIONELLA PNEUMOPHILA SEROGP 1 UR AG: L. pneumophila Serogp 1 Ur Ag: NEGATIVE

## 2022-10-28 LAB — URINE CULTURE: Culture: NO GROWTH

## 2022-10-28 MED ORDER — TRAMADOL HCL 50 MG PO TABS: 50.0000 mg | ORAL_TABLET | ORAL | Status: DC

## 2022-10-28 MED ORDER — METOPROLOL TARTRATE 5 MG/5ML IV SOLN: 5.0000 mg | INTRAVENOUS | Status: DC | PRN

## 2022-10-28 MED ORDER — TRAZODONE HCL 50 MG PO TABS: 50.0000 mg | ORAL_TABLET | Freq: Every evening | ORAL | Status: DC | PRN

## 2022-10-28 MED ORDER — DULOXETINE HCL 60 MG PO CPEP: 60.0000 mg | ORAL_CAPSULE | Freq: Every day | ORAL | Status: DC

## 2022-10-28 MED ORDER — FAMOTIDINE 20 MG PO TABS: 20.0000 mg | ORAL_TABLET | Freq: Every day | ORAL | 0 refills | Status: AC

## 2022-10-28 MED ORDER — VITAMIN C 500 MG PO TABS: 1000.0000 mg | ORAL_TABLET | Freq: Every day | ORAL | Status: DC

## 2022-10-28 MED ORDER — GUAIFENESIN ER 600 MG PO TB12: 600.0000 mg | ORAL_TABLET | ORAL | Status: DC

## 2022-10-28 MED ORDER — IPRATROPIUM-ALBUTEROL 0.5-2.5 (3) MG/3ML IN SOLN: 3.0000 mL | RESPIRATORY_TRACT | Status: DC | PRN

## 2022-10-28 MED ORDER — PREDNISONE 20 MG PO TABS: 40.0000 mg | ORAL_TABLET | Freq: Every day | ORAL | 0 refills | Status: AC

## 2022-10-28 MED ORDER — SENNOSIDES-DOCUSATE SODIUM 8.6-50 MG PO TABS: 1.0000 | ORAL_TABLET | Freq: Every evening | ORAL | Status: DC | PRN

## 2022-10-28 MED ORDER — HYDRALAZINE HCL 20 MG/ML IJ SOLN: 10.0000 mg | INTRAMUSCULAR | Status: DC | PRN

## 2022-10-28 MED ORDER — ONDANSETRON HCL 4 MG/2ML IJ SOLN: 4.0000 mg | Freq: Four times a day (QID) | INTRAMUSCULAR | Status: DC | PRN

## 2022-10-28 MED ORDER — ALUM & MAG HYDROXIDE-SIMETH 200-200-20 MG/5ML PO SUSP
30.0000 mL | Freq: Four times a day (QID) | ORAL | Status: DC | PRN
  Administered 2022-10-28: 30 mL via ORAL
  Filled 2022-10-28: qty 30

## 2022-10-28 NOTE — Progress Notes (Signed)
Mobility Specialist - Progress Note   10/28/22 0929  Mobility  Activity Ambulated with assistance in hallway  Level of Assistance Modified independent, requires aide device or extra time  Assistive Device None  Distance Ambulated (ft) 500 ft  Activity Response Tolerated well  Mobility Referral Yes  $Mobility charge 1 Mobility   Pt received in bed and agreed to mobility, no c/o pain, some SOB nearing EOS. Claimed it was a lot better than yesterdays session with PT. Pt returned to bed with all needs met.   Roderick Pee Mobility Specialist

## 2022-10-28 NOTE — Progress Notes (Signed)
Patient was given discharge instructions, and all questions were answered.  Patient was stable for discharge and was taken to the main exit by wheelchair. 

## 2022-10-28 NOTE — Discharge Summary (Signed)
Physician Discharge Summary  Julie Duran [facility] GHW:[id number] DOB: [birth year] DOA: 10/26/2022  PCP: Carol Ada, MD  Admit date: 10/26/2022 Discharge date: 10/28/2022  Admitted From: Home Disposition: Home  Recommendations for Outpatient Follow-up:  Follow up with PCP in 1 weeks Please obtain BMP/CBC in one week your next doctors visit.  Advised to take oral prednisone daily for next 3 days.  During this time also advised to use her albuterol inhaler at least 3-4 times daily thereafter as needed Pepcid prescribed for her hiatal hernia causing some abdominal discomfort and heartburn.  I have advised her to follow-up outpatient with PCP and possibly get GI referral Recommend 2 view chest x-ray in about 6-8 weeks  Home Health: None Equipment/Devices: None Discharge Condition: Stable CODE STATUS: Full code Diet recommendation: Regular  Brief/Interim Summary: 76 year old female with history of fibromyalgia, anxiety comes to the hospital with complaints of fever and cough.  Patient had the symptoms about a week ago and went to outpatient urgent care where she was prescribed Augmentin, doxycycline, prednisone and albuterol inhaler.  Despite of using that she continued to have fever and did not feel well therefore came to the hospital.  Upon admission she was noted to have left lower lobe bronchopneumonia on the CTA chest and she was started on IV vancomycin and cefepime.  Extensive workup including respiratory panel, BNP, procalcitonin were negative.  She did receive 2-3 days of IV vancomycin and cefepime which was eventually discontinued. Patient is medically stable today and feeling much better.  Stable for discharge with recommendations as mentioned above.  Daughter present at bedside as well   Discharge Diagnoses:  Principal Problem:   Community acquired pneumonia Active Problems:   Sepsis ([place])   Reactive airway disease  Sepsis secondary to left lower lobe  bronchopneumonia - Suspect viral pneumonia.  CTA chest is negative for PE but does show bronchopneumonia.  Respiratory panel, COVID/flu has been negative.  Procalcitonin negative, BNP normal.  Will recommend continue using home albuterol inhaler for next 3 days along with prednisone as she is wheezing a bit.  Overall she is feeling much better and back to her baseline in terms of activity and would like to go home.  She will need repeat chest x-ray in about 6-8 weeks with her PCP.  Fibromyalgia - Continue home medication  Anxiety - On Klonopin    Consultations: None  Subjective: Feels great and wants to go home.  Ambulating without any shortness of breath or congestion according to her.  Daughter is also present at bedside. Patient states she is already finished outpatient 5-day course of doxycycline.  Discharge Exam: Vitals:   10/27/22 2139 10/28/22 0800  BP: (!) 154/71   Pulse: 75   Resp: 18   Temp: 97.7 F (36.5 C)   SpO2: 96% 97%   Vitals:   10/27/22 1324 10/27/22 1709 10/27/22 2139 10/28/22 0800  BP: (!) 140/64 (!) 141/78 (!) 154/71   Pulse: 77 72 75   Resp: '16 16 18   '$ Temp: 98.3 F (36.8 C) 98.6 F (37 C) 97.7 F (36.5 C)   TempSrc: Oral Oral Oral   SpO2: 94% 94% 96% 97%  Weight:      Height:        General: Pt is alert, awake, not in acute distress Cardiovascular: RRR, S1/S2 +, no rubs, no gallops Respiratory: Some bilateral rhonchi with very mild expiratory wheezing Abdominal: Soft, NT, ND, bowel sounds + Extremities: no edema, no cyanosis  Discharge Instructions   Allergies  as of 10/28/2022       Reactions   Oxycodone-acetaminophen         Medication List     TAKE these medications    albuterol 108 (90 Base) MCG/ACT inhaler Commonly known as: VENTOLIN HFA Inhale 2 puffs into the lungs every 6 (six) hours as needed for wheezing or shortness of breath.   calcium carbonate 600 MG Tabs tablet Commonly known as: OS-CAL Take 600 mg by mouth  daily with breakfast.   clonazePAM 0.5 MG tablet Commonly known as: KLONOPIN Take 0.75 mg by mouth at bedtime.   Delsym 30 MG/5ML liquid Generic drug: dextromethorphan Take 30 mg by mouth at bedtime as needed for cough.   DULoxetine 60 MG capsule Commonly known as: CYMBALTA Take 60 mg by mouth daily.   ESTRACE VAGINAL 0.1 MG/GM vaginal cream Generic drug: estradiol Place 1 Applicatorful vaginally once a week.   estradiol 0.05 mg/24hr patch Commonly known as: CLIMARA - Dosed in mg/24 hr Place 0.025 mg onto the skin 2 (two) times a week.   famotidine 20 MG tablet Commonly known as: PEPCID Take 1 tablet (20 mg total) by mouth at bedtime.   guaiFENesin 600 MG 12 hr tablet Commonly known as: MUCINEX Take 1,200 mg by mouth in the morning.   loratadine 10 MG tablet Commonly known as: CLARITIN Take 10 mg by mouth daily.   lovastatin 10 MG tablet Commonly known as: MEVACOR Take 10 mg by mouth at bedtime.   MULTIVITAMIN ADULT EXTRA C PO Take 1 tablet by mouth daily.   predniSONE 20 MG tablet Commonly known as: DELTASONE Take 2 tablets (40 mg total) by mouth daily with breakfast for 3 days. TAKE 6 TABLETS BY MOUTH DAILY FOR 3 DAYS THEN DECREASE TO 3 TABS DAILY FOR 3 DAYS THEN STOP What changed:  medication strength how much to take when to take this   TART CHERRY PO Take 1 tablet by mouth at bedtime.   traMADol 50 MG tablet Commonly known as: ULTRAM Take 50 mg by mouth 2 (two) times daily.   vitamin C 1000 MG tablet Take 1,000 mg by mouth daily.        Follow-up Information     Carol Ada, MD Follow up in 1 week(s).   Specialty: Family Medicine Contact information: [address] [place] [address] [phone]                Allergies  Allergen Reactions   Oxycodone-Acetaminophen     You were cared for by a hospitalist during your hospital stay. If you have any questions about your discharge medications or the care you  received while you were in the hospital after you are discharged, you can call the unit and asked to speak with the hospitalist on call if the hospitalist that took care of you is not available. Once you are discharged, your primary care physician will handle any further medical issues. Please note that no refills for any discharge medications will be authorized once you are discharged, as it is imperative that you return to your primary care physician (or establish a relationship with a primary care physician if you do not have one) for your aftercare needs so that they can reassess your need for medications and monitor your lab values.   Procedures/Studies: CT Angio Chest PE W and/or Wo Contrast  Result Date: 10/26/2022 CLINICAL DATA:  Shortness of breath with cough and intermittent fever; PE suspected EXAM: CT ANGIOGRAPHY CHEST WITH  CONTRAST TECHNIQUE: Multidetector CT imaging of the chest was performed using the standard protocol during bolus administration of intravenous contrast. Multiplanar CT image reconstructions and MIPs were obtained to evaluate the vascular anatomy. RADIATION DOSE REDUCTION: This exam was performed according to the departmental dose-optimization program which includes automated exposure control, adjustment of the mA and/or kV according to patient size and/or use of iterative reconstruction technique. CONTRAST:  176m OMNIPAQUE IOHEXOL 350 MG/ML SOLN COMPARISON:  Radiographs earlier today FINDINGS: Cardiovascular: Satisfactory opacification of the pulmonary arteries to the segmental level. No evidence of pulmonary embolism. Mild coronary artery atherosclerotic calcification. Normal heart size. No pericardial effusion. Aortic calcification. Mediastinum/Nodes: No enlarged mediastinal, hilar, or axillary lymph nodes. Small hiatal hernia. Lungs/Pleura: Bronchial wall thickening with mucous plugging in the left lower lobe. Associated peribronchovascular consolidation and multiple  centrilobular micro nodules in the left lower lobe and to a lesser extent in the lingula. 4 mm nodule in the posterior right lower lobe (9/91). Upper Abdomen: No acute abnormality. Musculoskeletal: No chest wall abnormality. No acute osseous findings. Review of the MIP images confirms the above findings. IMPRESSION: Left lower lobe bronchopneumonia. Follow-up in 4-6 weeks after treatment is recommended to exclude underlying mass. Negative for pulmonary embolism. Aortic Atherosclerosis (ICD10-I70.0). Electronically Signed   By: TPlacido SouM.D.   On: 10/26/2022 15:44   DG Chest 2 View  Result Date: 10/26/2022 CLINICAL DATA:  Shortness of breath, cough, intermittent fever. EXAM: CHEST - 2 VIEW COMPARISON:  Chest radiograph 05/24/2009 FINDINGS: The cardiomediastinal silhouette is normal. There is retrocardiac opacity best seen on the lateral projection. There is no other focal airspace disease. There is no pulmonary edema. There is no pleural effusion or pneumothorax There is no acute osseous abnormality. IMPRESSION: Retrocardiac opacity suspicious for pneumonia. Recommend follow-up two-view chest radiographs in 3-4 weeks to ensure resolution. Electronically Signed   By: PValetta MoleM.D.   On: 10/26/2022 11:05     The results of significant diagnostics from this hospitalization (including imaging, microbiology, ancillary and laboratory) are listed below for reference.     Microbiology: Recent Results (from the past 240 hour(s))  Resp Panel by RT-PCR (Flu A&B, Covid) Anterior Nasal Swab     Status: None   Collection Time: 10/26/22 10:31 AM   Specimen: Anterior Nasal Swab  Result Value Ref Range Status   SARS Coronavirus 2 by RT PCR NEGATIVE NEGATIVE Final    Comment: (NOTE) SARS-CoV-2 target nucleic acids are NOT DETECTED.  The SARS-CoV-2 RNA is generally detectable in upper respiratory specimens during the acute phase of infection. The lowest concentration of SARS-CoV-2 viral copies this  assay can detect is 138 copies/mL. A negative result does not preclude SARS-Cov-2 infection and should not be used as the sole basis for treatment or other patient management decisions. A negative result may occur with  improper specimen collection/handling, submission of specimen other than nasopharyngeal swab, presence of viral mutation(s) within the areas targeted by this assay, and inadequate number of viral copies(<138 copies/mL). A negative result must be combined with clinical observations, patient history, and epidemiological information. The expected result is Negative.  Fact Sheet for Patients:  hEntrepreneurPulse.com.au Fact Sheet for Healthcare Providers:  hIncredibleEmployment.be This test is no t yet approved or cleared by the [place] and  has been authorized for detection and/or diagnosis of SARS-CoV-2 by FDA under an Emergency Use Authorization (EUA). This EUA will remain  in effect (meaning this test can be used) for the duration of the COVID-19  declaration under Section 564(b)(1) of the Act, 21 U.S.C.section 360bbb-3(b)(1), unless the authorization is terminated  or revoked sooner.       Influenza A by PCR NEGATIVE NEGATIVE Final   Influenza B by PCR NEGATIVE NEGATIVE Final    Comment: (NOTE) The Xpert Xpress SARS-CoV-2/FLU/RSV plus assay is intended as an aid in the diagnosis of influenza from Nasopharyngeal swab specimens and should not be used as a sole basis for treatment. Nasal washings and aspirates are unacceptable for Xpert Xpress SARS-CoV-2/FLU/RSV testing.  Fact Sheet for Patients: EntrepreneurPulse.com.au  Fact Sheet for Healthcare Providers: IncredibleEmployment.be  This test is not yet approved or cleared by the [place] FDA and has been authorized for detection and/or diagnosis of SARS-CoV-2 by FDA under an Emergency Use Authorization (EUA). This EUA will  remain in effect (meaning this test can be used) for the duration of the COVID-19 declaration under Section 564(b)(1) of the Act, 21 U.S.C. section 360bbb-3(b)(1), unless the authorization is terminated or revoked.  Performed at [facility], [address], [place], [place] [address]   Respiratory (~20 pathogens) panel by PCR     Status: None   Collection Time: 10/27/22 12:32 AM   Specimen: Nasopharyngeal Swab; Respiratory  Result Value Ref Range Status   Adenovirus NOT DETECTED NOT DETECTED Final   Coronavirus 229E NOT DETECTED NOT DETECTED Final    Comment: (NOTE) The Coronavirus on the Respiratory Panel, DOES NOT test for the novel  Coronavirus (2019 nCoV)    Coronavirus HKU1 NOT DETECTED NOT DETECTED Final   Coronavirus NL63 NOT DETECTED NOT DETECTED Final   Coronavirus OC43 NOT DETECTED NOT DETECTED Final   Metapneumovirus NOT DETECTED NOT DETECTED Final   Rhinovirus / Enterovirus NOT DETECTED NOT DETECTED Final   Influenza A NOT DETECTED NOT DETECTED Final   Influenza B NOT DETECTED NOT DETECTED Final   Parainfluenza Virus 1 NOT DETECTED NOT DETECTED Final   Parainfluenza Virus 2 NOT DETECTED NOT DETECTED Final   Parainfluenza Virus 3 NOT DETECTED NOT DETECTED Final   Parainfluenza Virus 4 NOT DETECTED NOT DETECTED Final   Respiratory Syncytial Virus NOT DETECTED NOT DETECTED Final   Bordetella pertussis NOT DETECTED NOT DETECTED Final   Bordetella Parapertussis NOT DETECTED NOT DETECTED Final   Chlamydophila pneumoniae NOT DETECTED NOT DETECTED Final   Mycoplasma pneumoniae NOT DETECTED NOT DETECTED Final    Comment: Performed at [facility], [address], [place], [place] [address]  MRSA Next Gen by PCR, Nasal     Status: None   Collection Time: 10/27/22 12:32 AM   Specimen: Nasal Mucosa; Nasal Swab  Result Value Ref Range Status   MRSA by PCR Next Gen NOT DETECTED NOT DETECTED Final    Comment: (NOTE) The GeneXpert MRSA Assay (FDA  approved for NASAL specimens only), is one component of a comprehensive MRSA colonization surveillance program. It is not intended to diagnose MRSA infection nor to guide or monitor treatment for MRSA infections. Test performance is not FDA approved in patients less than 81 years old. Performed at [facility], [place] [address], [place], [place] [address]   Expectorated Sputum Assessment w Gram Stain, Rflx to Resp Cult     Status: None   Collection Time: 10/27/22  3:58 AM   Specimen: Expectorated Sputum  Result Value Ref Range Status   Specimen Description EXPECTORATED SPUTUM  Final   Special Requests NONE  Final   Sputum evaluation   Final    THIS SPECIMEN IS  ACCEPTABLE FOR SPUTUM CULTURE Performed at [facility], [place] [address] [place], [place] [address]    Report Status 10/27/2022 FINAL  Final  Culture, Respiratory w Gram Stain     Status: None (Preliminary result)   Collection Time: 10/27/22  3:58 AM  Result Value Ref Range Status   Specimen Description   Final    EXPECTORATED SPUTUM Performed at [facility] [address], [place], [place] [address]    Special Requests   Final    NONE Reflexed from [id number] Performed at [facility] [address], [place], [place] [address]    Gram Stain   Final    RARE GRAM POSITIVE COCCI IN CLUSTERS RARE GRAM NEGATIVE RODS RARE WBC PRESENT, PREDOMINANTLY PMN    Culture   Final    CULTURE REINCUBATED FOR BETTER GROWTH Performed at [facility], [place] [address], [place], [place] [address]    Report Status PENDING  Incomplete  Culture, blood (routine x 2) Call MD if unable to obtain prior to antibiotics being given     Status: None (Preliminary result)   Collection Time: 10/27/22  6:49 AM   Specimen: BLOOD  Result Value Ref Range Status   Specimen Description   Final    BLOOD SITE NOT SPECIFIED Performed at [facility]  [address], [place], [place] [address]    Special Requests   Final    BOTTLES DRAWN AEROBIC AND ANAEROBIC Blood Culture adequate volume Performed at [facility] [address], [place], [place] [address]    Culture   Final    NO GROWTH < 24 HOURS Performed at [facility] [address] [place], [place] [address]    Report Status PENDING  Incomplete  Culture, blood (routine x 2) Call MD if unable to obtain prior to antibiotics being given     Status: None (Preliminary result)   Collection Time: 10/27/22  6:49 AM   Specimen: BLOOD  Result Value Ref Range Status   Specimen Description   Final    BLOOD SITE NOT SPECIFIED Performed at [facility] [address], [place], [place] [address]    Special Requests   Final    BOTTLES DRAWN AEROBIC AND ANAEROBIC Blood Culture adequate volume Performed at [facility] [address], [place], [place] [address]    Culture   Final    NO GROWTH < 24 HOURS Performed at [facility] [address], [place], [place] [address]    Report Status PENDING  Incomplete     Labs: BNP (last 3 results) Recent Labs    10/26/22 1237  BNP 74.1   Basic Metabolic Panel: Recent Labs  Lab 10/26/22 1338 10/26/22 2208 10/27/22 0125  NA 138 142 140  K 4.0 4.7 4.0  CL 101 110 108  CO2 25 16* 23  GLUCOSE 155* 93 95  BUN '17 20 20  '$ CREATININE 0.92 0.74 0.79  CALCIUM 9.1 9.5 8.6*  MG 2.4  --  2.2  PHOS  --  3.2 3.5   Liver Function Tests: Recent Labs  Lab 10/26/22 2208 10/27/22 0125  AST 38 35  ALT 39 36  ALKPHOS 91 77  BILITOT 0.3 0.5  PROT 7.4 6.3*  ALBUMIN 3.7 3.0*   No results for input(s): "LIPASE", "AMYLASE" in the last 168 hours. No results for input(s): "AMMONIA" in the last 168 hours. CBC: Recent Labs  Lab 10/26/22 1237 10/27/22 0125  WBC 10.9* 12.0*  NEUTROABS 9.2*  --   HGB 14.2 13.3  HCT 42.8 39.7  MCV 93.7 95.9  PLT 350 299   Cardiac  Enzymes: Recent Labs  Lab 10/26/22 2208  CKTOTAL 65   BNP: Invalid input(s): "POCBNP" CBG: No results for input(s): "GLUCAP" in the last 168 hours. D-Dimer No results for input(s): "DDIMER" in the last 72 hours. Hgb A1c No results for input(s): "HGBA1C" in the last 72 hours. Lipid Profile No results for input(s): "CHOL", "HDL", "LDLCALC", "TRIG", "CHOLHDL", "LDLDIRECT" in the last 72 hours. Thyroid function studies Recent Labs    10/26/22 2208  TSH 1.765   Anemia work up No results for input(s): "VITAMINB12", "FOLATE", "FERRITIN", "TIBC", "IRON", "RETICCTPCT" in the last 72 hours. Urinalysis    Component Value Date/Time   COLORURINE COLORLESS (A) 10/27/2022 0034   APPEARANCEUR CLEAR 10/27/2022 0034   LABSPEC 1.004 (L) 10/27/2022 0034   PHURINE 7.0 10/27/2022 0034   GLUCOSEU NEGATIVE 10/27/2022 0034   HGBUR NEGATIVE 10/27/2022 0034   BILIRUBINUR NEGATIVE 10/27/2022 0034   KETONESUR NEGATIVE 10/27/2022 0034   PROTEINUR NEGATIVE 10/27/2022 0034   NITRITE NEGATIVE 10/27/2022 0034   LEUKOCYTESUR NEGATIVE 10/27/2022 0034   Sepsis Labs Recent Labs  Lab 10/26/22 1237 10/27/22 0125  WBC 10.9* 12.0*   Microbiology Recent Results (from the past 240 hour(s))  Resp Panel by RT-PCR (Flu A&B, Covid) Anterior Nasal Swab     Status: None   Collection Time: 10/26/22 10:31 AM   Specimen: Anterior Nasal Swab  Result Value Ref Range Status   SARS Coronavirus 2 by RT PCR NEGATIVE NEGATIVE Final    Comment: (NOTE) SARS-CoV-2 target nucleic acids are NOT DETECTED.  The SARS-CoV-2 RNA is generally detectable in upper respiratory specimens during the acute phase of infection. The lowest concentration of SARS-CoV-2 viral copies this assay can detect is 138 copies/mL. A negative result does not preclude SARS-Cov-2 infection and should not be used as the sole basis for treatment or other patient management decisions. A negative result may occur with  improper specimen  collection/handling, submission of specimen other than nasopharyngeal swab, presence of viral mutation(s) within the areas targeted by this assay, and inadequate number of viral copies(<138 copies/mL). A negative result must be combined with clinical observations, patient history, and epidemiological information. The expected result is Negative.  Fact Sheet for Patients:  EntrepreneurPulse.com.au  Fact Sheet for Healthcare Providers:  IncredibleEmployment.be  This test is no t yet approved or cleared by the [place] FDA and  has been authorized for detection and/or diagnosis of SARS-CoV-2 by FDA under an Emergency Use Authorization (EUA). This EUA will remain  in effect (meaning this test can be used) for the duration of the COVID-19 declaration under Section 564(b)(1) of the Act, 21 U.S.C.section 360bbb-3(b)(1), unless the authorization is terminated  or revoked sooner.       Influenza A by PCR NEGATIVE NEGATIVE Final   Influenza B by PCR NEGATIVE NEGATIVE Final    Comment: (NOTE) The Xpert Xpress SARS-CoV-2/FLU/RSV plus assay is intended as an aid in the diagnosis of influenza from Nasopharyngeal swab specimens and should not be used as a sole basis for treatment. Nasal washings and aspirates are unacceptable for Xpert Xpress SARS-CoV-2/FLU/RSV testing.  Fact Sheet for Patients: EntrepreneurPulse.com.au  Fact Sheet for Healthcare Providers: IncredibleEmployment.be  This test is not yet approved or cleared by the [place] FDA and has been authorized for detection and/or diagnosis of SARS-CoV-2 by FDA  under an Emergency Use Authorization (EUA). This EUA will remain in effect (meaning this test can be used) for the duration of the COVID-19 declaration under Section 564(b)(1) of the Act, 21 U.S.C. section 360bbb-3(b)(1), unless the authorization is terminated or revoked.  Performed at [facility], [address] [place], [place] [address]   Respiratory (~20 pathogens) panel by PCR     Status: None   Collection Time: 10/27/22 12:32 AM   Specimen: Nasopharyngeal Swab; Respiratory  Result Value Ref Range Status   Adenovirus NOT DETECTED NOT DETECTED Final   Coronavirus 229E NOT DETECTED NOT DETECTED Final    Comment: (NOTE) The Coronavirus on the Respiratory Panel, DOES NOT test for the novel  Coronavirus (2019 nCoV)    Coronavirus HKU1 NOT DETECTED NOT DETECTED Final   Coronavirus NL63 NOT DETECTED NOT DETECTED Final   Coronavirus OC43 NOT DETECTED NOT DETECTED Final   Metapneumovirus NOT DETECTED NOT DETECTED Final   Rhinovirus / Enterovirus NOT DETECTED NOT DETECTED Final   Influenza A NOT DETECTED NOT DETECTED Final   Influenza B NOT DETECTED NOT DETECTED Final   Parainfluenza Virus 1 NOT DETECTED NOT DETECTED Final   Parainfluenza Virus 2 NOT DETECTED NOT DETECTED Final   Parainfluenza Virus 3 NOT DETECTED NOT DETECTED Final   Parainfluenza Virus 4 NOT DETECTED NOT DETECTED Final   Respiratory Syncytial Virus NOT DETECTED NOT DETECTED Final   Bordetella pertussis NOT DETECTED NOT DETECTED Final   Bordetella Parapertussis NOT DETECTED NOT DETECTED Final   Chlamydophila pneumoniae NOT DETECTED NOT DETECTED Final   Mycoplasma pneumoniae NOT DETECTED NOT DETECTED Final    Comment: Performed at [facility], [address] [place], [place] [address]  MRSA Next Gen by PCR, Nasal     Status: None   Collection Time: 10/27/22 12:32 AM   Specimen: Nasal Mucosa; Nasal Swab  Result Value Ref Range Status   MRSA by PCR Next Gen NOT DETECTED NOT DETECTED Final    Comment: (NOTE) The GeneXpert MRSA Assay (FDA approved for NASAL specimens only), is one component of a comprehensive MRSA colonization surveillance program. It is not intended to diagnose MRSA infection nor to guide or monitor treatment for MRSA infections. Test performance is not FDA  approved in patients less than 64 years old. Performed at [facility], [place] [address] [place], [place] [address]   Expectorated Sputum Assessment w Gram Stain, Rflx to Resp Cult     Status: None   Collection Time: 10/27/22  3:58 AM   Specimen: Expectorated Sputum  Result Value Ref Range Status   Specimen Description EXPECTORATED SPUTUM  Final   Special Requests NONE  Final   Sputum evaluation   Final    THIS SPECIMEN IS ACCEPTABLE FOR SPUTUM CULTURE Performed at [facility], [place] [address], [place], [place] [address]    Report Status 10/27/2022 FINAL  Final  Culture, Respiratory w Gram Stain     Status: None (Preliminary result)   Collection Time: 10/27/22  3:58 AM  Result Value Ref Range Status   Specimen Description   Final    EXPECTORATED SPUTUM Performed at [facility] [address], [place], [place] [address]    Special Requests   Final    NONE Reflexed from [id number] Performed at [facility] [address] [place], [place] [address]    Gram Stain   Final    RARE GRAM POSITIVE COCCI IN CLUSTERS RARE GRAM NEGATIVE  RODS RARE WBC PRESENT, PREDOMINANTLY PMN    Culture   Final    CULTURE REINCUBATED FOR BETTER GROWTH Performed at [facility], [place] [address] [place], [place] [address]    Report Status PENDING  Incomplete  Culture, blood (routine x 2) Call MD if unable to obtain prior to antibiotics being given     Status: None (Preliminary result)   Collection Time: 10/27/22  6:49 AM   Specimen: BLOOD  Result Value Ref Range Status   Specimen Description   Final    BLOOD SITE NOT SPECIFIED Performed at [facility] [address] [place], [place] [address]    Special Requests   Final    BOTTLES DRAWN AEROBIC AND ANAEROBIC Blood Culture adequate volume Performed at [facility] [address], [place], [place] [address]    Culture   Final     NO GROWTH < 24 HOURS Performed at [facility] [address], [place], [place] [address]    Report Status PENDING  Incomplete  Culture, blood (routine x 2) Call MD if unable to obtain prior to antibiotics being given     Status: None (Preliminary result)   Collection Time: 10/27/22  6:49 AM   Specimen: BLOOD  Result Value Ref Range Status   Specimen Description   Final    BLOOD SITE NOT SPECIFIED Performed at [facility] [address], [place], [place] [address]    Special Requests   Final    BOTTLES DRAWN AEROBIC AND ANAEROBIC Blood Culture adequate volume Performed at [facility] [address], [place], [place] [address]    Culture   Final    NO GROWTH < 24 HOURS Performed at [facility] [address] [place],  [address]    Report Status PENDING  Incomplete     Time coordinating discharge:  I have spent 35 minutes face to face with the patient and on the ward discussing the patients care, assessment, plan and disposition with other care givers. >50% of the time was devoted counseling the patient about the risks and benefits of treatment/Discharge disposition and coordinating care.   SIGNED:   Damita Lack, MD  [facility] 10/28/2022, 11:24 AM   If 7PM-7AM, please contact night-coverage

## 2022-10-28 NOTE — Progress Notes (Signed)
  Transition of Care [facility]) Screening Note   Patient Details  Name: Julie Duran Date of Birth: [birth year]   Transition of Care [facility]) CM/SW Contact:    Lennart Pall, LCSW Phone Number: 10/28/2022, 9:34 AM    Transition of Care Department [facility]) has reviewed patient and no TOC needs have been identified at this time. We will continue to monitor patient advancement through interdisciplinary progression rounds. If new patient transition needs arise, please place a TOC consult.

## 2022-10-30 LAB — CULTURE, RESPIRATORY W GRAM STAIN

## 2022-11-01 ENCOUNTER — Ambulatory Visit: Payer: Medicare Other | Admitting: Family Medicine

## 2022-11-01 LAB — CULTURE, BLOOD (ROUTINE X 2)
Culture: NO GROWTH
Culture: NO GROWTH
Special Requests: ADEQUATE
Special Requests: ADEQUATE

## 2022-12-17 DIAGNOSIS — Z85828 Personal history of other malignant neoplasm of skin: Secondary | ICD-10-CM | POA: Diagnosis not present

## 2022-12-17 DIAGNOSIS — L03011 Cellulitis of right finger: Secondary | ICD-10-CM | POA: Diagnosis not present

## 2023-01-13 ENCOUNTER — Ambulatory Visit
Admission: RE | Admit: 2023-01-13 | Discharge: 2023-01-13 | Disposition: A | Discharge status: Home / Self Care | Payer: Medicare Other | Source: Ambulatory Visit | Attending: Family Medicine | Admitting: Family Medicine

## 2023-01-13 ENCOUNTER — Other Ambulatory Visit: Payer: Self-pay | Admitting: Family Medicine

## 2023-01-13 DIAGNOSIS — Z8701 Personal history of pneumonia (recurrent): Secondary | ICD-10-CM

## 2023-02-05 DIAGNOSIS — Z780 Asymptomatic menopausal state: Secondary | ICD-10-CM | POA: Diagnosis not present

## 2023-02-05 DIAGNOSIS — Z1231 Encounter for screening mammogram for malignant neoplasm of breast: Secondary | ICD-10-CM | POA: Diagnosis not present

## 2023-02-05 DIAGNOSIS — Z79890 Hormone replacement therapy (postmenopausal): Secondary | ICD-10-CM | POA: Diagnosis not present

## 2023-02-05 DIAGNOSIS — Z01419 Encounter for gynecological examination (general) (routine) without abnormal findings: Secondary | ICD-10-CM | POA: Diagnosis not present

## 2023-02-18 DIAGNOSIS — H43393 Other vitreous opacities, bilateral: Secondary | ICD-10-CM | POA: Diagnosis not present

## 2023-02-18 DIAGNOSIS — H53143 Visual discomfort, bilateral: Secondary | ICD-10-CM | POA: Diagnosis not present

## 2023-02-18 DIAGNOSIS — H26492 Other secondary cataract, left eye: Secondary | ICD-10-CM | POA: Diagnosis not present

## 2023-02-18 DIAGNOSIS — Z9841 Cataract extraction status, right eye: Secondary | ICD-10-CM | POA: Diagnosis not present

## 2023-03-12 ENCOUNTER — Other Ambulatory Visit: Payer: Self-pay | Admitting: Family Medicine

## 2023-03-12 DIAGNOSIS — M797 Fibromyalgia: Secondary | ICD-10-CM | POA: Diagnosis not present

## 2023-03-12 DIAGNOSIS — Z1331 Encounter for screening for depression: Secondary | ICD-10-CM | POA: Diagnosis not present

## 2023-03-12 DIAGNOSIS — F325 Major depressive disorder, single episode, in full remission: Secondary | ICD-10-CM | POA: Diagnosis not present

## 2023-03-12 DIAGNOSIS — Z13820 Encounter for screening for osteoporosis: Secondary | ICD-10-CM | POA: Diagnosis not present

## 2023-03-12 DIAGNOSIS — F5101 Primary insomnia: Secondary | ICD-10-CM | POA: Diagnosis not present

## 2023-03-12 DIAGNOSIS — E785 Hyperlipidemia, unspecified: Secondary | ICD-10-CM | POA: Diagnosis not present

## 2023-03-12 DIAGNOSIS — Z0000 Encounter for general adult medical examination without abnormal findings: Secondary | ICD-10-CM | POA: Diagnosis not present

## 2023-05-14 ENCOUNTER — Ambulatory Visit: Payer: Medicare Other

## 2023-05-14 ENCOUNTER — Ambulatory Visit (INDEPENDENT_AMBULATORY_CARE_PROVIDER_SITE_OTHER): Payer: Medicare Other | Admitting: Podiatry

## 2023-05-14 ENCOUNTER — Encounter: Payer: Self-pay | Admitting: Podiatry

## 2023-05-14 DIAGNOSIS — M7661 Achilles tendinitis, right leg: Secondary | ICD-10-CM

## 2023-05-14 MED ORDER — MELOXICAM 15 MG PO TABS: 15.0000 mg | ORAL_TABLET | Freq: Every day | ORAL | 0 refills | Status: DC

## 2023-05-14 NOTE — Patient Instructions (Signed)

## 2023-05-14 NOTE — Progress Notes (Signed)
  Subjective:  Patient ID: Julie Duran, female    DOB: 1946/01/07,   MRN: [id number]  Chief Complaint  Patient presents with   Ankle Pain    Right ankle pain on going for 6 weeks.     77 y.o. female presents for concern of right ankle pain that has been ongoing for 6 weeks. Denies any injury  Has been using heel cups which help. She has had this problem years ago and heel cups helped in the past. Denies any other pedal complaints. Denies n/v/f/c.   Past Medical History:  Diagnosis Date   Bronchitis    Depression    Fibromyalgia     Objective:  Physical Exam: Vascular: DP/PT pulses 2/4 bilateral. CFT <3 seconds. Normal hair growth on digits. No edema.  Skin. No lacerations or abrasions bilateral feet.  Musculoskeletal: MMT 5/5 bilateral lower extremities in DF, PF, Inversion and Eversion. Deceased ROM in DF of ankle joint.  Tender to insertion of achilles tendon on right. Minimal pain with DF and PF. More so tender to medial insertion site near bursa.  Neurological: Sensation intact to light touch.   Assessment:   1. Right ankle pain, unspecified chronicity      Plan:  Patient was evaluated and treated and all questions answered. -Xrays reviewed. No acute fracture or dislocations. Mild spurring noted to posterior calcaneus.  -Discussed Achilles insertional tendonitis and treatment options with patient.  -Discussed stretching exercises. -Rx Meloxicam provided. Reviewed previous CMP and kidney function looked normal.  -Heel lifts provided and discussed proper shoewear.  -Discussed if no improvement will consider MRI/PT/EPAT/PRP injections.  -Patient to return to office in 6 weeks.    Louann Sjogren, DPM

## 2023-06-25 ENCOUNTER — Ambulatory Visit (INDEPENDENT_AMBULATORY_CARE_PROVIDER_SITE_OTHER): Payer: Medicare Other | Admitting: Podiatry

## 2023-06-25 DIAGNOSIS — M7661 Achilles tendinitis, right leg: Secondary | ICD-10-CM | POA: Diagnosis not present

## 2023-06-25 MED ORDER — MELOXICAM 15 MG PO TABS: 15.0000 mg | ORAL_TABLET | Freq: Every day | ORAL | 0 refills | Status: AC

## 2023-06-25 NOTE — Progress Notes (Signed)
  Subjective:  Patient ID: Julie Duran, female    DOB: [birth year],   MRN: [id number]  Chief Complaint  Patient presents with   Ankle Pain     Right ankle pain has not gotten any better since the last time she was seen. Pt stated that the pain has actual got worse.      77 y.o. female presents for follow-up of right achilles tendonitis. Relates still have pain since last visit. Relates the meloxicam helped but the stretching was making it more painful.  Denies any other pedal complaints. Denies n/v/f/c.   Past Medical History:  Diagnosis Date   Bronchitis    Depression    Fibromyalgia     Objective:  Physical Exam: Vascular: DP/PT pulses 2/4 bilateral. CFT <3 seconds. Normal hair growth on digits. No edema.  Skin. No lacerations or abrasions bilateral feet.  Musculoskeletal: MMT 5/5 bilateral lower extremities in DF, PF, Inversion and Eversion. Deceased ROM in DF of ankle joint.  Tender to insertion of achilles tendon on right. Minimal pain with DF and PF. More so tender to medial insertion site near bursa.  Neurological: Sensation intact to light touch.   Assessment:   1. Tendonitis, Achilles, right       Plan:  Patient was evaluated and treated and all questions answered. -Xrays reviewed. No acute fracture or dislocations. Mild spurring noted to posterior calcaneus.  -Discussed Achilles insertional tendonitis and treatment options with patient.  -Continue stretching exercises and heel lifts.  -Rx Meloxicam refilled.  -Discussed if no improvement will consider MRI/surgery/EPAT/PRP injections.  -Patient to return to office in 8-9 weeks.    Louann Sjogren, DPM

## 2023-06-30 NOTE — Therapy (Signed)
OUTPATIENT PHYSICAL THERAPY LOWER EXTREMITY EVALUATION   Patient Name: Julie Duran MRN: [id number] DOB:March 06, 1946, 77 y.o., female Today's Date: 07/01/2023  END OF SESSION:  PT End of Session - 07/01/23 1555     Visit Number 1    Number of Visits 12    Date for PT Re-Evaluation 08/26/23    Authorization Type MCR    PT Start Time 1400    PT Stop Time 1445    PT Time Calculation (min) 45 min    Activity Tolerance Patient tolerated treatment well    Behavior During Therapy [facility] for tasks assessed/performed             Past Medical History:  Diagnosis Date   Bronchitis    Depression    Fibromyalgia    Past Surgical History:  Procedure Laterality Date   HAND SURGERY Left 2008   HAND SURGERY Right 2012   TOTAL ABDOMINAL HYSTERECTOMY  1982   Patient Active Problem List   Diagnosis Date Noted   Community acquired pneumonia 10/26/2022   Sepsis (HCC) 10/26/2022   Reactive airway disease 10/26/2022    PCP: Merri Brunette, MD   REFERRING PROVIDER: Louann Sjogren, DPM  REFERRING DIAG: M76.61 (ICD-10-CM) - Tendonitis, Achilles, right  THERAPY DIAG:  Muscle weakness (generalized)  Achilles tendinitis of right lower extremity  Other abnormalities of gait and mobility  Rationale for Evaluation and Treatment: Rehabilitation  ONSET DATE: 3+ months  SUBJECTIVE:   SUBJECTIVE STATEMENT: Relates a 3 month history of R achilles pain, no improvement with heel lifts, anti inflammatories or HEP.  Also has fibromyalgia  PERTINENT HISTORY: Patient was evaluated and treated and all questions answered. -Xrays reviewed. No acute fracture or dislocations. Mild spurring noted to posterior calcaneus.  -Discussed Achilles insertional tendonitis and treatment options with patient.  -Continue stretching exercises and heel lifts.  -Rx Meloxicam refilled.  -Discussed if no improvement will consider MRI/surgery/EPAT/PRP injections.  -Patient to return to office in 8-9  weeks.  PAIN:  Are you having pain? Yes: NPRS scale: 8/10 Pain location: R achilles Pain description: ache, sharp Aggravating factors: weightbearing tasks, walking, steps Relieving factors: rest  PRECAUTIONS: None  RED FLAGS: None   WEIGHT BEARING RESTRICTIONS: No  FALLS:  Has patient fallen in last 6 months? No  OCCUPATION: retired  PLOF: Independent  PATIENT GOALS: To manage my ankle symptoms  NEXT MD VISIT: 8 weeks  OBJECTIVE:   DIAGNOSTIC FINDINGS: none available  PATIENT SURVEYS:  LEFS 52/80  MUSCLE LENGTH: deferred  POSTURE: No Significant postural limitations  PALPATION: TTP R soleus muscle with active trigger point noted as well as insertion site of achilles tendon distally  LOWER EXTREMITY ROM:  A/PROM Right eval Left eval  Hip flexion    Hip extension    Hip abduction    Hip adduction    Hip internal rotation    Hip external rotation    Knee flexion    Knee extension    Ankle dorsiflexion 10/18d   Ankle plantarflexion WFL   Ankle inversion WFL   Ankle eversion WFL    (Blank rows = not tested)  LOWER EXTREMITY MMT:  MMT Right eval Left eval  Hip flexion    Hip extension    Hip abduction    Hip adduction    Hip internal rotation    Hip external rotation    Knee flexion    Knee extension    Ankle dorsiflexion 5   Ankle plantarflexion 4 P!   Ankle inversion  5   Ankle eversion 5    (Blank rows = not tested)  LOWER EXTREMITY SPECIAL TESTS:  Ankle special tests: Anterior drawer test: negative and Talar tilt test: negative  FUNCTIONAL TESTS:  5 times sit to stand: 13s  GAIT: Distance walked: 63ft x2 Assistive device utilized: None Level of assistance: Complete Independence Comments: decreased R toe off   TODAY'S TREATMENT:                                                                                                                              DATE: 07/01/23 Eval and HEP   PATIENT EDUCATION:  Education details:  Discussed eval findings, rehab rationale and POC and patient is in agreement  Person educated: Patient Education method: Explanation Education comprehension: verbalized understanding and needs further education  HOME EXERCISE PROGRAM: Access Code: [email or web address] URL: [email or web address] Date: 07/01/2023 Prepared by: Gustavus Bryant  Exercises - Long Sitting Plantar Fascia Stretch with Towel  - 2 x daily - 5 x weekly - 1 sets - 2 reps - 30s hold - Soleus Stretch on Wall  - 2 x daily - 5 x weekly - 1 sets - 2 reps - 30s hold - Gastroc Stretch on Wall  - 2 x daily - 5 x weekly - 1 sets - 2 reps - 30s hold  ASSESSMENT:  CLINICAL IMPRESSION: Patient is a 77 y.o. female who was seen today for physical therapy evaluation and treatment for R achilles tendinitis pain. Patient demonstrates only mild ROM deficits in R ankle as well as weakness in plantar flexion due to pain at achilles insertion site.  Palpation finds tenderness to achilles insertion site as well as R soleus muscle.  Patient is a good candidate for OPPT incorporating TPDN to relieve symptoms and regain function.  OBJECTIVE IMPAIRMENTS: Abnormal gait, decreased activity tolerance, decreased mobility, difficulty walking, decreased ROM, decreased strength, and pain.   ACTIVITY LIMITATIONS: standing, squatting, and stairs  PERSONAL FACTORS: Age, Fitness, and Time since onset of injury/illness/exacerbation are also affecting patient's functional outcome.   REHAB POTENTIAL: Good  CLINICAL DECISION MAKING: Stable/uncomplicated  EVALUATION COMPLEXITY: Low   GOALS: Goals reviewed with patient? No  SHORT TERM GOALS: Target date: 07/22/2023   Patient to demonstrate independence in HEP  Baseline: [id number] Goal status: INITIAL   LONG TERM GOALS: Target date: 08/12/2023    Increase AROM R DF to 15d Baseline:  A/PROM Right eval Left eval  Hip flexion    Hip extension    Hip abduction    Hip adduction    Hip  internal rotation    Hip external rotation    Knee flexion    Knee extension    Ankle dorsiflexion 10/18d    Goal status: INITIAL  2.  Increase R PF strength to 4+/5 Baseline:  MMT Right eval Left eval  Hip flexion    Hip extension    Hip abduction    Hip adduction    Hip  internal rotation    Hip external rotation    Knee flexion    Knee extension    Ankle dorsiflexion 5   Ankle plantarflexion 4 P!   Ankle inversion 5   Ankle eversion 5    Goal status: INITIAL  3.  Increase LEFS score to 60/80 Baseline: 52/80 Goal status: INITIAL  4.  Decrease worst pain to 4/10 Baseline: 8/10 Goal status: INITIAL   PLAN:  PT FREQUENCY: 1-2x/week  PT DURATION: 6 weeks  PLANNED INTERVENTIONS: Therapeutic exercises, Therapeutic activity, Neuromuscular re-education, Balance training, Gait training, Patient/Family education, Self Care, Joint mobilization, Stair training, Aquatic Therapy, Dry Needling, Electrical stimulation, Cryotherapy, Moist heat, Manual therapy, and Re-evaluation  PLAN FOR NEXT SESSION: HEP review and update, manual techniques as appropriate, aerobic tasks, ROM and flexibility activities, strengthening and PREs, TPDN, gait and balance training as needed     Hildred Laser, PT 07/01/2023, 5:16 PM

## 2023-07-01 ENCOUNTER — Other Ambulatory Visit: Payer: Self-pay

## 2023-07-01 ENCOUNTER — Ambulatory Visit: Payer: Medicare Other | Attending: Podiatry

## 2023-07-01 DIAGNOSIS — R2689 Other abnormalities of gait and mobility: Secondary | ICD-10-CM | POA: Insufficient documentation

## 2023-07-01 DIAGNOSIS — M6281 Muscle weakness (generalized): Secondary | ICD-10-CM | POA: Insufficient documentation

## 2023-07-01 DIAGNOSIS — M7661 Achilles tendinitis, right leg: Secondary | ICD-10-CM | POA: Insufficient documentation

## 2023-07-14 NOTE — Therapy (Unsigned)
OUTPATIENT PHYSICAL THERAPY LOWER EXTREMITY EVALUATION   Patient Name: Julie Duran MRN: [id number] DOB:17-Feb-1946, 77 y.o., female Today's Date: 07/15/2023  END OF SESSION:  PT End of Session - 07/15/23 1130     Visit Number 2    Number of Visits 12    Date for PT Re-Evaluation 08/26/23    Authorization Type MCR    PT Start Time 1130    PT Stop Time 1210    PT Time Calculation (min) 40 min    Activity Tolerance Patient tolerated treatment well    Behavior During Therapy [facility] for tasks assessed/performed              Past Medical History:  Diagnosis Date   Bronchitis    Depression    Fibromyalgia    Past Surgical History:  Procedure Laterality Date   HAND SURGERY Left 2008   HAND SURGERY Right 2012   TOTAL ABDOMINAL HYSTERECTOMY  1982   Patient Active Problem List   Diagnosis Date Noted   Community acquired pneumonia 10/26/2022   Sepsis (HCC) 10/26/2022   Reactive airway disease 10/26/2022    PCP: Merri Brunette, MD   REFERRING PROVIDER: Louann Sjogren, DPM  REFERRING DIAG: M76.61 (ICD-10-CM) - Tendonitis, Achilles, right  THERAPY DIAG:  Muscle weakness (generalized)  Achilles tendinitis of right lower extremity  Other abnormalities of gait and mobility  Rationale for Evaluation and Treatment: Rehabilitation  ONSET DATE: 3+ months  SUBJECTIVE:   SUBJECTIVE STATEMENT: No changes to note, receptive to [facility].  PERTINENT HISTORY: Patient was evaluated and treated and all questions answered. -Xrays reviewed. No acute fracture or dislocations. Mild spurring noted to posterior calcaneus.  -Discussed Achilles insertional tendonitis and treatment options with patient.  -Continue stretching exercises and heel lifts.  -Rx Meloxicam refilled.  -Discussed if no improvement will consider MRI/surgery/EPAT/PRP injections.  -Patient to return to office in 8-9 weeks.  PAIN:  Are you having pain? Yes: NPRS scale: 8/10 Pain location: R  achilles Pain description: ache, sharp Aggravating factors: weightbearing tasks, walking, steps Relieving factors: rest  PRECAUTIONS: None  RED FLAGS: None   WEIGHT BEARING RESTRICTIONS: No  FALLS:  Has patient fallen in last 6 months? No  OCCUPATION: retired  PLOF: Independent  PATIENT GOALS: To manage my ankle symptoms  NEXT MD VISIT: 8 weeks  OBJECTIVE:   DIAGNOSTIC FINDINGS: none available  PATIENT SURVEYS:  LEFS 52/80  MUSCLE LENGTH: deferred  POSTURE: No Significant postural limitations  PALPATION: TTP R soleus muscle with active trigger point noted as well as insertion site of achilles tendon distally  LOWER EXTREMITY ROM:  A/PROM Right eval Left eval  Hip flexion    Hip extension    Hip abduction    Hip adduction    Hip internal rotation    Hip external rotation    Knee flexion    Knee extension    Ankle dorsiflexion 10/18d   Ankle plantarflexion WFL   Ankle inversion WFL   Ankle eversion WFL    (Blank rows = not tested)  LOWER EXTREMITY MMT:  MMT Right eval Left eval  Hip flexion    Hip extension    Hip abduction    Hip adduction    Hip internal rotation    Hip external rotation    Knee flexion    Knee extension    Ankle dorsiflexion 5   Ankle plantarflexion 4 P!   Ankle inversion 5   Ankle eversion 5    (Blank rows = not tested)  LOWER EXTREMITY SPECIAL TESTS:  Ankle special tests: Anterior drawer test: negative and Talar tilt test: negative  FUNCTIONAL TESTS:  5 times sit to stand: 13s  GAIT: Distance walked: 52ft x2 Assistive device utilized: None Level of assistance: Complete Independence Comments: decreased R toe off   TODAY'S TREATMENT:         [facility] Adult PT Treatment:                                                DATE: 07/15/23 Therapeutic Exercise: Nustep L4 8 min PF against wall 15x DF against wall 15x Alternate march against wall 15/15 Inv/ev RTB 2x15 B Manual Therapy: Skilled palpation to identify  taught bands in R medial gastroc/soleus complex Trigger Point Dry Needling Treatment: Pre-treatment instruction: Patient instructed on dry needling rationale, procedures, and possible side effects including pain during treatment (achy,cramping feeling), bruising, drop of blood, lightheadedness, nausea, sweating. Patient Consent Given: Yes Education handout provided: No Muscles treated: R gastroc/soleus  Needle size and number: .25x30mm x 2 Electrical stimulation performed: No Parameters: N/A Treatment response/outcome: Twitch response elicited, Palpable decrease in muscle tension, and trace blood loss Post-treatment instructions: Patient instructed to expect possible mild to moderate muscle soreness later today and/or tomorrow. Patient instructed in methods to reduce muscle soreness and to continue prescribed HEP. If patient was dry needled over the lung field, patient was instructed on signs and symptoms of pneumothorax and, however unlikely, to see immediate medical attention should they occur. Patient was also educated on signs and symptoms of infection and to seek medical attention should they occur. Patient verbalized understanding of these instructions and education.                                                                                                                       DATE: 07/01/23 Eval and HEP   PATIENT EDUCATION:  Education details: Discussed eval findings, rehab rationale and POC and patient is in agreement  Person educated: Patient Education method: Explanation Education comprehension: verbalized understanding and needs further education  HOME EXERCISE PROGRAM: Access Code: [email or web address] URL: [email or web address] Date: 07/01/2023 Prepared by: Gustavus Bryant  Exercises - Long Sitting Plantar Fascia Stretch with Towel  - 2 x daily - 5 x weekly - 1 sets - 2 reps - 30s hold - Soleus Stretch on Wall  - 2 x daily - 5 x weekly - 1 sets - 2 reps - 30s hold -  Gastroc Stretch on Wall  - 2 x daily - 5 x weekly - 1 sets - 2 reps - 30s hold  ASSESSMENT:  CLINICAL IMPRESSION: First f/u session.  Patient agreeable to TPDN to  soleus f/b aerobic work, stretching, HEP review and strengthening.  Good tolerance to all tasks.  (Eval)Patient is a 77 y.o. female who was seen today for physical therapy evaluation and treatment for R  achilles tendinitis pain. Patient demonstrates only mild ROM deficits in R ankle as well as weakness in plantar flexion due to pain at achilles insertion site.  Palpation finds tenderness to achilles insertion site as well as R soleus muscle.  Patient is a good candidate for OPPT incorporating TPDN to relieve symptoms and regain function.  OBJECTIVE IMPAIRMENTS: Abnormal gait, decreased activity tolerance, decreased mobility, difficulty walking, decreased ROM, decreased strength, and pain.   ACTIVITY LIMITATIONS: standing, squatting, and stairs  PERSONAL FACTORS: Age, Fitness, and Time since onset of injury/illness/exacerbation are also affecting patient's functional outcome.   REHAB POTENTIAL: Good  CLINICAL DECISION MAKING: Stable/uncomplicated  EVALUATION COMPLEXITY: Low   GOALS: Goals reviewed with patient? No  SHORT TERM GOALS: Target date: 07/22/2023   Patient to demonstrate independence in HEP  Baseline: [id number] Goal status: INITIAL   LONG TERM GOALS: Target date: 08/12/2023    Increase AROM R DF to 15d Baseline:  A/PROM Right eval Left eval  Hip flexion    Hip extension    Hip abduction    Hip adduction    Hip internal rotation    Hip external rotation    Knee flexion    Knee extension    Ankle dorsiflexion 10/18d    Goal status: INITIAL  2.  Increase R PF strength to 4+/5 Baseline:  MMT Right eval Left eval  Hip flexion    Hip extension    Hip abduction    Hip adduction    Hip internal rotation    Hip external rotation    Knee flexion    Knee extension    Ankle dorsiflexion 5   Ankle  plantarflexion 4 P!   Ankle inversion 5   Ankle eversion 5    Goal status: INITIAL  3.  Increase LEFS score to 60/80 Baseline: 52/80 Goal status: INITIAL  4.  Decrease worst pain to 4/10 Baseline: 8/10 Goal status: INITIAL   PLAN:  PT FREQUENCY: 1-2x/week  PT DURATION: 6 weeks  PLANNED INTERVENTIONS: Therapeutic exercises, Therapeutic activity, Neuromuscular re-education, Balance training, Gait training, Patient/Family education, Self Care, Joint mobilization, Stair training, Aquatic Therapy, Dry Needling, Electrical stimulation, Cryotherapy, Moist heat, Manual therapy, and Re-evaluation  PLAN FOR NEXT SESSION: HEP review and update, manual techniques as appropriate, aerobic tasks, ROM and flexibility activities, strengthening and PREs, TPDN, gait and balance training as needed     Hildred Laser, PT 07/15/2023, 11:31 AM

## 2023-07-15 ENCOUNTER — Ambulatory Visit: Payer: Medicare Other | Attending: Podiatry

## 2023-07-15 DIAGNOSIS — M7661 Achilles tendinitis, right leg: Secondary | ICD-10-CM | POA: Diagnosis not present

## 2023-07-15 DIAGNOSIS — R2689 Other abnormalities of gait and mobility: Secondary | ICD-10-CM | POA: Diagnosis not present

## 2023-07-15 DIAGNOSIS — M6281 Muscle weakness (generalized): Secondary | ICD-10-CM | POA: Diagnosis not present

## 2023-07-15 NOTE — Therapy (Signed)
OUTPATIENT PHYSICAL THERAPY LOWER EXTREMITY EVALUATION   Patient Name: Julie Duran MRN: [id number] DOB:[birth year], 77 y.o., female Today's Date: 07/18/2023  END OF SESSION:  PT End of Session - 07/18/23 1301     Visit Number 3    Number of Visits 12    Date for PT Re-Evaluation 08/26/23    Authorization Type MCR    PT Start Time 1215    PT Stop Time 1300    PT Time Calculation (min) 45 min    Behavior During Therapy [facility] for tasks assessed/performed               Past Medical History:  Diagnosis Date   Bronchitis    Depression    Fibromyalgia    Past Surgical History:  Procedure Laterality Date   HAND SURGERY Left 2008   HAND SURGERY Right 2012   TOTAL ABDOMINAL HYSTERECTOMY  1982   Patient Active Problem List   Diagnosis Date Noted   Community acquired pneumonia 10/26/2022   Sepsis (HCC) 10/26/2022   Reactive airway disease 10/26/2022    PCP: Merri Brunette, MD   REFERRING PROVIDER: Louann Sjogren, DPM  REFERRING DIAG: M76.61 (ICD-10-CM) - Tendonitis, Achilles, right  THERAPY DIAG:  Muscle weakness (generalized)  Achilles tendinitis of right lower extremity  Other abnormalities of gait and mobility  Rationale for Evaluation and Treatment: Rehabilitation  ONSET DATE: 3+ months  SUBJECTIVE:   SUBJECTIVE STATEMENT: No changes to note, receptive to [facility].  PERTINENT HISTORY: Patient was evaluated and treated and all questions answered. -Xrays reviewed. No acute fracture or dislocations. Mild spurring noted to posterior calcaneus.  -Discussed Achilles insertional tendonitis and treatment options with patient.  -Continue stretching exercises and heel lifts.  -Rx Meloxicam refilled.  -Discussed if no improvement will consider MRI/surgery/EPAT/PRP injections.  -Patient to return to office in 8-9 weeks.  PAIN:  Are you having pain? Yes: NPRS scale: 8/10 Pain location: R achilles Pain description: ache, sharp Aggravating factors:  weightbearing tasks, walking, steps Relieving factors: rest  PRECAUTIONS: None  RED FLAGS: None   WEIGHT BEARING RESTRICTIONS: No  FALLS:  Has patient fallen in last 6 months? No  OCCUPATION: retired  PLOF: Independent  PATIENT GOALS: To manage my ankle symptoms  NEXT MD VISIT: 8 weeks  OBJECTIVE:   DIAGNOSTIC FINDINGS: none available  PATIENT SURVEYS:  LEFS 52/80  MUSCLE LENGTH: deferred  POSTURE: No Significant postural limitations  PALPATION: TTP R soleus muscle with active trigger point noted as well as insertion site of achilles tendon distally  LOWER EXTREMITY ROM:  A/PROM Right eval Left eval  Hip flexion    Hip extension    Hip abduction    Hip adduction    Hip internal rotation    Hip external rotation    Knee flexion    Knee extension    Ankle dorsiflexion 10/18d   Ankle plantarflexion WFL   Ankle inversion WFL   Ankle eversion WFL    (Blank rows = not tested)  LOWER EXTREMITY MMT:  MMT Right eval Left eval  Hip flexion    Hip extension    Hip abduction    Hip adduction    Hip internal rotation    Hip external rotation    Knee flexion    Knee extension    Ankle dorsiflexion 5   Ankle plantarflexion 4 P!   Ankle inversion 5   Ankle eversion 5    (Blank rows = not tested)  LOWER EXTREMITY SPECIAL TESTS:  Ankle special  tests: Anterior drawer test: negative and Talar tilt test: negative  FUNCTIONAL TESTS:  5 times sit to stand: 13s  GAIT: Distance walked: 29ft x2 Assistive device utilized: None Level of assistance: Complete Independence Comments: decreased R toe off   TODAY'S TREATMENT:         [facility] Adult PT Treatment:                                                DATE: 07/18/23 Therapeutic Exercise: Nustep L4 8 min PF against wall 15x DF against wall 15x Alternate march against wall 15/15 Inv/ev RTB 2x15 B Manual Therapy: Skilled palpation to identify taught bands in R medial gastroc head. Trigger Point Dry  Needling Treatment: Pre-treatment instruction: Patient instructed on dry needling rationale, procedures, and possible side effects including pain during treatment (achy,cramping feeling), bruising, drop of blood, lightheadedness, nausea, sweating. Patient Consent Given: Yes Education handout provided: No Muscles treated: R gastroc/soleus  Needle size and number: .25x51mm x 2 Electrical stimulation performed: No Parameters: N/A Treatment response/outcome: Twitch response elicited, Palpable decrease in muscle tension, and trace blood loss Post-treatment instructions: Patient instructed to expect possible mild to moderate muscle soreness later today and/or tomorrow. Patient instructed in methods to reduce muscle soreness and to continue prescribed HEP. If patient was dry needled over the lung field, patient was instructed on signs and symptoms of pneumothorax and, however unlikely, to see immediate medical attention should they occur. Patient was also educated on signs and symptoms of infection and to seek medical attention should they occur. Patient verbalized understanding of these instructions and education.   [facility] Adult PT Treatment:                                                DATE: 07/15/23 Therapeutic Exercise: Nustep L4 8 min PF against wall 15x DF against wall 15x Alternate march against wall 15/15 Inv/ev RTB 2x15 B Manual Therapy: Skilled palpation to identify taught bands in R medial gastroc/soleus complex Trigger Point Dry Needling Treatment: Pre-treatment instruction: Patient instructed on dry needling rationale, procedures, and possible side effects including pain during treatment (achy,cramping feeling), bruising, drop of blood, lightheadedness, nausea, sweating. Patient Consent Given: Yes Education handout provided: No Muscles treated: R gastroc/soleus  Needle size and number: .25x23mm x 2 Electrical stimulation performed: No Parameters: N/A Treatment response/outcome:  Twitch response elicited, Palpable decrease in muscle tension, and trace blood loss Post-treatment instructions: Patient instructed to expect possible mild to moderate muscle soreness later today and/or tomorrow. Patient instructed in methods to reduce muscle soreness and to continue prescribed HEP. If patient was dry needled over the lung field, patient was instructed on signs and symptoms of pneumothorax and, however unlikely, to see immediate medical attention should they occur. Patient was also educated on signs and symptoms of infection and to seek medical attention should they occur. Patient verbalized understanding of these instructions and education.  DATE: 07/01/23 Eval and HEP   PATIENT EDUCATION:  Education details: Discussed eval findings, rehab rationale and POC and patient is in agreement  Person educated: Patient Education method: Explanation Education comprehension: verbalized understanding and needs further education  HOME EXERCISE PROGRAM: Access Code: [email or web address] URL: [email or web address] Date: 07/01/2023 Prepared by: Gustavus Bryant  Exercises - Long Sitting Plantar Fascia Stretch with Towel  - 2 x daily - 5 x weekly - 1 sets - 2 reps - 30s hold - Soleus Stretch on Wall  - 2 x daily - 5 x weekly - 1 sets - 2 reps - 30s hold - Gastroc Stretch on Wall  - 2 x daily - 5 x weekly - 1 sets - 2 reps - 30s hold  ASSESSMENT:  CLINICAL IMPRESSION: No major benefit from TPDN lasts session but no adverse effects either.  Continued TPDN but to medial R gastroc head f/b aerobic work for peripheral circulation, concentric/eccentric strengthening.  Able to tolerate all stretches and tasks w/o symptom aggravation  (Eval)Patient is a 77 y.o. female who was seen today for physical therapy evaluation and treatment for R achilles tendinitis pain. Patient  demonstrates only mild ROM deficits in R ankle as well as weakness in plantar flexion due to pain at achilles insertion site.  Palpation finds tenderness to achilles insertion site as well as R soleus muscle.  Patient is a good candidate for OPPT incorporating TPDN to relieve symptoms and regain function.  OBJECTIVE IMPAIRMENTS: Abnormal gait, decreased activity tolerance, decreased mobility, difficulty walking, decreased ROM, decreased strength, and pain.   ACTIVITY LIMITATIONS: standing, squatting, and stairs  PERSONAL FACTORS: Age, Fitness, and Time since onset of injury/illness/exacerbation are also affecting patient's functional outcome.   REHAB POTENTIAL: Good  CLINICAL DECISION MAKING: Stable/uncomplicated  EVALUATION COMPLEXITY: Low   GOALS: Goals reviewed with patient? No  SHORT TERM GOALS: Target date: 07/22/2023   Patient to demonstrate independence in HEP  Baseline: [id number] Goal status: INITIAL   LONG TERM GOALS: Target date: 08/12/2023    Increase AROM R DF to 15d Baseline:  A/PROM Right eval Left eval  Hip flexion    Hip extension    Hip abduction    Hip adduction    Hip internal rotation    Hip external rotation    Knee flexion    Knee extension    Ankle dorsiflexion 10/18d    Goal status: INITIAL  2.  Increase R PF strength to 4+/5 Baseline:  MMT Right eval Left eval  Hip flexion    Hip extension    Hip abduction    Hip adduction    Hip internal rotation    Hip external rotation    Knee flexion    Knee extension    Ankle dorsiflexion 5   Ankle plantarflexion 4 P!   Ankle inversion 5   Ankle eversion 5    Goal status: INITIAL  3.  Increase LEFS score to 60/80 Baseline: 52/80 Goal status: INITIAL  4.  Decrease worst pain to 4/10 Baseline: 8/10 Goal status: INITIAL   PLAN:  PT FREQUENCY: 1-2x/week  PT DURATION: 6 weeks  PLANNED INTERVENTIONS: Therapeutic exercises, Therapeutic activity, Neuromuscular re-education, Balance  training, Gait training, Patient/Family education, Self Care, Joint mobilization, Stair training, Aquatic Therapy, Dry Needling, Electrical stimulation, Cryotherapy, Moist heat, Manual therapy, and Re-evaluation  PLAN FOR NEXT SESSION: HEP review and update, manual techniques as appropriate, aerobic tasks, ROM and flexibility activities, strengthening and PREs, TPDN, gait and balance training as needed  Hildred Laser, PT 07/18/2023, 1:02 PM

## 2023-07-18 ENCOUNTER — Ambulatory Visit: Payer: Medicare Other

## 2023-07-18 DIAGNOSIS — M6281 Muscle weakness (generalized): Secondary | ICD-10-CM

## 2023-07-18 DIAGNOSIS — M7661 Achilles tendinitis, right leg: Secondary | ICD-10-CM | POA: Diagnosis not present

## 2023-07-18 DIAGNOSIS — R2689 Other abnormalities of gait and mobility: Secondary | ICD-10-CM

## 2023-07-22 ENCOUNTER — Ambulatory Visit: Payer: Medicare Other

## 2023-07-22 DIAGNOSIS — M6281 Muscle weakness (generalized): Secondary | ICD-10-CM | POA: Diagnosis not present

## 2023-07-22 DIAGNOSIS — R2689 Other abnormalities of gait and mobility: Secondary | ICD-10-CM

## 2023-07-22 DIAGNOSIS — M7661 Achilles tendinitis, right leg: Secondary | ICD-10-CM | POA: Diagnosis not present

## 2023-07-22 NOTE — Therapy (Signed)
OUTPATIENT PHYSICAL THERAPY TREATMENT   Patient Name: Julie Duran MRN: [id number] DOB:26-Feb-1946, 77 y.o., female Today's Date: 07/22/2023  END OF SESSION:  PT End of Session - 07/22/23 1208     Visit Number 4    Number of Visits 12    Date for PT Re-Evaluation 08/26/23    Authorization Type MCR    PT Start Time 1210    PT Stop Time 1250    PT Time Calculation (min) 40 min    Behavior During Therapy [facility] for tasks assessed/performed                Past Medical History:  Diagnosis Date   Bronchitis    Depression    Fibromyalgia    Past Surgical History:  Procedure Laterality Date   HAND SURGERY Left 2008   HAND SURGERY Right 2012   TOTAL ABDOMINAL HYSTERECTOMY  1982   Patient Active Problem List   Diagnosis Date Noted   Community acquired pneumonia 10/26/2022   Sepsis (HCC) 10/26/2022   Reactive airway disease 10/26/2022    PCP: Merri Brunette, MD   REFERRING PROVIDER: Louann Sjogren, DPM  REFERRING DIAG: M76.61 (ICD-10-CM) - Tendonitis, Achilles, right  THERAPY DIAG:  Muscle weakness (generalized)  Achilles tendinitis of right lower extremity  Other abnormalities of gait and mobility  Rationale for Evaluation and Treatment: Rehabilitation  ONSET DATE: 3+ months  SUBJECTIVE:   SUBJECTIVE STATEMENT: Pt presents to PT with continued R heel pain. Has been compliant with HEP.   PERTINENT HISTORY: Patient was evaluated and treated and all questions answered. -Xrays reviewed. No acute fracture or dislocations. Mild spurring noted to posterior calcaneus.  -Discussed Achilles insertional tendonitis and treatment options with patient.  -Continue stretching exercises and heel lifts.  -Rx Meloxicam refilled.  -Discussed if no improvement will consider MRI/surgery/EPAT/PRP injections.  -Patient to return to office in 8-9 weeks.  PAIN:  Are you having pain? Yes: NPRS scale: 8/10 Pain location: R achilles Pain description: ache,  sharp Aggravating factors: weightbearing tasks, walking, steps Relieving factors: rest  PRECAUTIONS: None  RED FLAGS: None   WEIGHT BEARING RESTRICTIONS: No  FALLS:  Has patient fallen in last 6 months? No  OCCUPATION: retired  PLOF: Independent  PATIENT GOALS: To manage my ankle symptoms  NEXT MD VISIT: 8 weeks  OBJECTIVE:   DIAGNOSTIC FINDINGS: none available  PATIENT SURVEYS:  LEFS 52/80  MUSCLE LENGTH: deferred  POSTURE: No Significant postural limitations  PALPATION: TTP R soleus muscle with active trigger point noted as well as insertion site of achilles tendon distally  LOWER EXTREMITY ROM:  A/PROM Right eval Left eval  Hip flexion    Hip extension    Hip abduction    Hip adduction    Hip internal rotation    Hip external rotation    Knee flexion    Knee extension    Ankle dorsiflexion 10/18d   Ankle plantarflexion WFL   Ankle inversion WFL   Ankle eversion WFL    (Blank rows = not tested)  LOWER EXTREMITY MMT:  MMT Right eval Left eval  Hip flexion    Hip extension    Hip abduction    Hip adduction    Hip internal rotation    Hip external rotation    Knee flexion    Knee extension    Ankle dorsiflexion 5   Ankle plantarflexion 4 P!   Ankle inversion 5   Ankle eversion 5    (Blank rows = not tested)  LOWER EXTREMITY SPECIAL TESTS:  Ankle special tests: Anterior drawer test: negative and Talar tilt test: negative  FUNCTIONAL TESTS:  5 times sit to stand: 13s  GAIT: Distance walked: 21ft x2 Assistive device utilized: None Level of assistance: Complete Independence Comments: decreased R toe off   TODAY'S TREATMENT:         [facility] Adult PT Treatment:                                                DATE: 07/22/23 Therapeutic Exercise: Nustep L5 x 3 min Standing gastroc stretch 2x30" R Eccentric heel lowering (2 up, 1 down) 2x10 R Manual Therapy: Skilled palpation to identify taught bands in R medial/lateral gastroc head STM  to R calf Trigger Point Dry Needling Treatment: Pre-treatment instruction: Patient instructed on dry needling rationale, procedures, and possible side effects including pain during treatment (achy,cramping feeling), bruising, drop of blood, lightheadedness, nausea, sweating. Patient Consent Given: Yes Education handout provided: No Muscles treated: R gastroc/soleus  Needle size and number: .30x10mm x 4 Electrical stimulation performed: Yes Parameters: Mirco x 5 min (low freq, low intensity); Milli x 5 min (high freq, high intensity) Treatment response/outcome: Twitch response elicited, Palpable decrease in muscle tension, and trace blood loss Post-treatment instructions: Patient instructed to expect possible mild to moderate muscle soreness later today and/or tomorrow. Patient instructed in methods to reduce muscle soreness and to continue prescribed HEP. If patient was dry needled over the lung field, patient was instructed on signs and symptoms of pneumothorax and, however unlikely, to see immediate medical attention should they occur. Patient was also educated on signs and symptoms of infection and to seek medical attention should they occur. Patient verbalized understanding of these instructions and education.   [facility] Adult PT Treatment:                                                DATE: 07/18/23 Therapeutic Exercise: Nustep L4 8 min PF against wall 15x DF against wall 15x Alternate march against wall 15/15 Inv/ev RTB 2x15 B Manual Therapy: Skilled palpation to identify taught bands in R medial gastroc head. Trigger Point Dry Needling Treatment: Pre-treatment instruction: Patient instructed on dry needling rationale, procedures, and possible side effects including pain during treatment (achy,cramping feeling), bruising, drop of blood, lightheadedness, nausea, sweating. Patient Consent Given: Yes Education handout provided: No Muscles treated: R gastroc/soleus  Needle size and number:  .25x92mm x 2 Electrical stimulation performed: No Parameters: N/A Treatment response/outcome: Twitch response elicited, Palpable decrease in muscle tension, and trace blood loss Post-treatment instructions: Patient instructed to expect possible mild to moderate muscle soreness later today and/or tomorrow. Patient instructed in methods to reduce muscle soreness and to continue prescribed HEP. If patient was dry needled over the lung field, patient was instructed on signs and symptoms of pneumothorax and, however unlikely, to see immediate medical attention should they occur. Patient was also educated on signs and symptoms of infection and to seek medical attention should they occur. Patient verbalized understanding of these instructions and education.   [facility] Adult PT Treatment:  DATE: 07/15/23 Therapeutic Exercise: Nustep L4 8 min PF against wall 15x DF against wall 15x Alternate march against wall 15/15 Inv/ev RTB 2x15 B Manual Therapy: Skilled palpation to identify taught bands in R medial gastroc/soleus complex Trigger Point Dry Needling Treatment: Pre-treatment instruction: Patient instructed on dry needling rationale, procedures, and possible side effects including pain during treatment (achy,cramping feeling), bruising, drop of blood, lightheadedness, nausea, sweating. Patient Consent Given: Yes Education handout provided: No Muscles treated: R gastroc/soleus  Needle size and number: .25x25mm x 2 Electrical stimulation performed: No Parameters: N/A Treatment response/outcome: Twitch response elicited, Palpable decrease in muscle tension, and trace blood loss Post-treatment instructions: Patient instructed to expect possible mild to moderate muscle soreness later today and/or tomorrow. Patient instructed in methods to reduce muscle soreness and to continue prescribed HEP. If patient was dry needled over the lung field, patient was instructed on  signs and symptoms of pneumothorax and, however unlikely, to see immediate medical attention should they occur. Patient was also educated on signs and symptoms of infection and to seek medical attention should they occur. Patient verbalized understanding of these instructions and education.                                                                                                                       DATE: 07/01/23 Eval and HEP   PATIENT EDUCATION:  Education details: Discussed eval findings, rehab rationale and POC and patient is in agreement  Person educated: Patient Education method: Explanation Education comprehension: verbalized understanding and needs further education  HOME EXERCISE PROGRAM: Access Code: [email or web address] URL: [email or web address] Date: 07/01/2023 Prepared by: Gustavus Bryant  Exercises - Long Sitting Plantar Fascia Stretch with Towel  - 2 x daily - 5 x weekly - 1 sets - 2 reps - 30s hold - Soleus Stretch on Wall  - 2 x daily - 5 x weekly - 1 sets - 2 reps - 30s hold - Gastroc Stretch on Wall  - 2 x daily - 5 x weekly - 1 sets - 2 reps - 30s hold  ASSESSMENT:  CLINICAL IMPRESSION: Pt was able to complete all prescribed exercises with no adverse effect. Therapy today continued to focus on decreasing pain, responded well to manual and TPDN. Incorporated eccentric loading to R heel today. Will continue to progress as able per POC.   (Eval)Patient is a 77 y.o. female who was seen today for physical therapy evaluation and treatment for R achilles tendinitis pain. Patient demonstrates only mild ROM deficits in R ankle as well as weakness in plantar flexion due to pain at achilles insertion site.  Palpation finds tenderness to achilles insertion site as well as R soleus muscle.  Patient is a good candidate for OPPT incorporating TPDN to relieve symptoms and regain function.  OBJECTIVE IMPAIRMENTS: Abnormal gait, decreased activity tolerance, decreased mobility,  difficulty walking, decreased ROM, decreased strength, and pain.   ACTIVITY LIMITATIONS: standing, squatting, and stairs  PERSONAL FACTORS: Age, Fitness, and Time since onset of injury/illness/exacerbation  are also affecting patient's functional outcome.   REHAB POTENTIAL: Good  CLINICAL DECISION MAKING: Stable/uncomplicated  EVALUATION COMPLEXITY: Low   GOALS: Goals reviewed with patient? No  SHORT TERM GOALS: Target date: 07/22/2023   Patient to demonstrate independence in HEP  Baseline: [id number] Goal status: INITIAL   LONG TERM GOALS: Target date: 08/12/2023    Increase AROM R DF to 15d Baseline:  A/PROM Right eval Left eval  Hip flexion    Hip extension    Hip abduction    Hip adduction    Hip internal rotation    Hip external rotation    Knee flexion    Knee extension    Ankle dorsiflexion 10/18d    Goal status: INITIAL  2.  Increase R PF strength to 4+/5 Baseline:  MMT Right eval Left eval  Hip flexion    Hip extension    Hip abduction    Hip adduction    Hip internal rotation    Hip external rotation    Knee flexion    Knee extension    Ankle dorsiflexion 5   Ankle plantarflexion 4 P!   Ankle inversion 5   Ankle eversion 5    Goal status: INITIAL  3.  Increase LEFS score to 60/80 Baseline: 52/80 Goal status: INITIAL  4.  Decrease worst pain to 4/10 Baseline: 8/10 Goal status: INITIAL   PLAN:  PT FREQUENCY: 1-2x/week  PT DURATION: 6 weeks  PLANNED INTERVENTIONS: Therapeutic exercises, Therapeutic activity, Neuromuscular re-education, Balance training, Gait training, Patient/Family education, Self Care, Joint mobilization, Stair training, Aquatic Therapy, Dry Needling, Electrical stimulation, Cryotherapy, Moist heat, Manual therapy, and Re-evaluation  PLAN FOR NEXT SESSION: HEP review and update, manual techniques as appropriate, aerobic tasks, ROM and flexibility activities, strengthening and PREs, TPDN, gait and balance training as  needed     Eloy End, PT 07/22/2023, 1:34 PM

## 2023-07-24 ENCOUNTER — Ambulatory Visit: Payer: Medicare Other

## 2023-07-24 DIAGNOSIS — M7661 Achilles tendinitis, right leg: Secondary | ICD-10-CM

## 2023-07-24 DIAGNOSIS — R2689 Other abnormalities of gait and mobility: Secondary | ICD-10-CM

## 2023-07-24 DIAGNOSIS — M6281 Muscle weakness (generalized): Secondary | ICD-10-CM

## 2023-07-24 NOTE — Therapy (Signed)
OUTPATIENT PHYSICAL THERAPY TREATMENT   Patient Name: Julie Duran MRN: [id number] DOB:12-Oct-1946, 77 y.o., female Today's Date: 07/24/2023  END OF SESSION:  PT End of Session - 07/24/23 1211     Visit Number 5    Number of Visits 12    Date for PT Re-Evaluation 08/26/23    Authorization Type MCR    PT Start Time 1215    PT Stop Time 1255    PT Time Calculation (min) 40 min    Behavior During Therapy [facility] for tasks assessed/performed                 Past Medical History:  Diagnosis Date   Bronchitis    Depression    Fibromyalgia    Past Surgical History:  Procedure Laterality Date   HAND SURGERY Left 2008   HAND SURGERY Right 2012   TOTAL ABDOMINAL HYSTERECTOMY  1982   Patient Active Problem List   Diagnosis Date Noted   Community acquired pneumonia 10/26/2022   Sepsis (HCC) 10/26/2022   Reactive airway disease 10/26/2022    PCP: Merri Brunette, MD   REFERRING PROVIDER: Louann Sjogren, DPM  REFERRING DIAG: M76.61 (ICD-10-CM) - Tendonitis, Achilles, right  THERAPY DIAG:  Muscle weakness (generalized)  Achilles tendinitis of right lower extremity  Other abnormalities of gait and mobility  Rationale for Evaluation and Treatment: Rehabilitation  ONSET DATE: 3+ months  SUBJECTIVE:   SUBJECTIVE STATEMENT: Pt presents to PT with continued R heel pain. Compliant with HEP.   PERTINENT HISTORY: Patient was evaluated and treated and all questions answered. -Xrays reviewed. No acute fracture or dislocations. Mild spurring noted to posterior calcaneus.  -Discussed Achilles insertional tendonitis and treatment options with patient.  -Continue stretching exercises and heel lifts.  -Rx Meloxicam refilled.  -Discussed if no improvement will consider MRI/surgery/EPAT/PRP injections.  -Patient to return to office in 8-9 weeks.  PAIN:  Are you having pain? Yes: NPRS scale: 8/10 Pain location: R achilles Pain description: ache,  sharp Aggravating factors: weightbearing tasks, walking, steps Relieving factors: rest  PRECAUTIONS: None  RED FLAGS: None   WEIGHT BEARING RESTRICTIONS: No  FALLS:  Has patient fallen in last 6 months? No  OCCUPATION: retired  PLOF: Independent  PATIENT GOALS: To manage my ankle symptoms  NEXT MD VISIT: 8 weeks  OBJECTIVE:   DIAGNOSTIC FINDINGS: none available  PATIENT SURVEYS:  LEFS 52/80  MUSCLE LENGTH: deferred  POSTURE: No Significant postural limitations  PALPATION: TTP R soleus muscle with active trigger point noted as well as insertion site of achilles tendon distally  LOWER EXTREMITY ROM:  A/PROM Right eval Left eval  Hip flexion    Hip extension    Hip abduction    Hip adduction    Hip internal rotation    Hip external rotation    Knee flexion    Knee extension    Ankle dorsiflexion 10/18d   Ankle plantarflexion WFL   Ankle inversion WFL   Ankle eversion WFL    (Blank rows = not tested)  LOWER EXTREMITY MMT:  MMT Right eval Left eval  Hip flexion    Hip extension    Hip abduction    Hip adduction    Hip internal rotation    Hip external rotation    Knee flexion    Knee extension    Ankle dorsiflexion 5   Ankle plantarflexion 4 P!   Ankle inversion 5   Ankle eversion 5    (Blank rows = not tested)  LOWER  EXTREMITY SPECIAL TESTS:  Ankle special tests: Anterior drawer test: negative and Talar tilt test: negative  FUNCTIONAL TESTS:  5 times sit to stand: 13s  GAIT: Distance walked: 82ft x2 Assistive device utilized: None Level of assistance: Complete Independence Comments: decreased R toe off   TODAY'S TREATMENT:         [facility] Adult PT Treatment:                                                DATE: 07/24/23 Therapeutic Exercise: Nustep L5 x 4 min while taking subjective Eccentric heel lowering (2 up, 1 down) 2x10 R Slant board gastroc stretch 2x60" Slant board soleus stretch 2x60" Heel raise with ball 2x15 Manual  Therapy: Skilled palpation to identify taught bands in R medial/lateral gastroc head IASTM to R medial gastroc Trigger Point Dry Needling Treatment: Pre-treatment instruction: Patient instructed on dry needling rationale, procedures, and possible side effects including pain during treatment (achy,cramping feeling), bruising, drop of blood, lightheadedness, nausea, sweating. Patient Consent Given: Yes Education handout provided: No Muscles treated: R gastroc/soleus  Needle size and number: .30x79mm x 2, .30x107mm x 2 Electrical stimulation performed: N/A N/A Treatment response/outcome: Twitch response elicited, Palpable decrease in muscle tension, and trace blood loss Post-treatment instructions: Patient instructed to expect possible mild to moderate muscle soreness later today and/or tomorrow. Patient instructed in methods to reduce muscle soreness and to continue prescribed HEP. If patient was dry needled over the lung field, patient was instructed on signs and symptoms of pneumothorax and, however unlikely, to see immediate medical attention should they occur. Patient was also educated on signs and symptoms of infection and to seek medical attention should they occur. Patient verbalized understanding of these instructions and education.   [facility] Adult PT Treatment:                                                DATE: 07/22/23 Therapeutic Exercise: Nustep L5 x 3 min Standing gastroc stretch 2x30" R Eccentric heel lowering (2 up, 1 down) 2x10 R Manual Therapy: Skilled palpation to identify taught bands in R medial/lateral gastroc head STM to R calf Trigger Point Dry Needling Treatment: Pre-treatment instruction: Patient instructed on dry needling rationale, procedures, and possible side effects including pain during treatment (achy,cramping feeling), bruising, drop of blood, lightheadedness, nausea, sweating. Patient Consent Given: Yes Education handout provided: No Muscles treated: R  gastroc/soleus  Needle size and number: .30x75mm x 4 Electrical stimulation performed: Yes Parameters: Mirco x 5 min (low freq, low intensity); Milli x 5 min (high freq, high intensity) Treatment response/outcome: Twitch response elicited, Palpable decrease in muscle tension, and trace blood loss Post-treatment instructions: Patient instructed to expect possible mild to moderate muscle soreness later today and/or tomorrow. Patient instructed in methods to reduce muscle soreness and to continue prescribed HEP. If patient was dry needled over the lung field, patient was instructed on signs and symptoms of pneumothorax and, however unlikely, to see immediate medical attention should they occur. Patient was also educated on signs and symptoms of infection and to seek medical attention should they occur. Patient verbalized understanding of these instructions and education.   [facility] Adult PT Treatment:  DATE: 07/18/23 Therapeutic Exercise: Nustep L4 8 min PF against wall 15x DF against wall 15x Alternate march against wall 15/15 Inv/ev RTB 2x15 B Manual Therapy: Skilled palpation to identify taught bands in R medial gastroc head. Trigger Point Dry Needling Treatment: Pre-treatment instruction: Patient instructed on dry needling rationale, procedures, and possible side effects including pain during treatment (achy,cramping feeling), bruising, drop of blood, lightheadedness, nausea, sweating. Patient Consent Given: Yes Education handout provided: No Muscles treated: R gastroc/soleus  Needle size and number: .25x44mm x 2 Electrical stimulation performed: No Parameters: N/A Treatment response/outcome: Twitch response elicited, Palpable decrease in muscle tension, and trace blood loss Post-treatment instructions: Patient instructed to expect possible mild to moderate muscle soreness later today and/or tomorrow. Patient instructed in methods to reduce muscle  soreness and to continue prescribed HEP. If patient was dry needled over the lung field, patient was instructed on signs and symptoms of pneumothorax and, however unlikely, to see immediate medical attention should they occur. Patient was also educated on signs and symptoms of infection and to seek medical attention should they occur. Patient verbalized understanding of these instructions and education.   [facility] Adult PT Treatment:                                                DATE: 07/15/23 Therapeutic Exercise: Nustep L4 8 min PF against wall 15x DF against wall 15x Alternate march against wall 15/15 Inv/ev RTB 2x15 B Manual Therapy: Skilled palpation to identify taught bands in R medial gastroc/soleus complex Trigger Point Dry Needling Treatment: Pre-treatment instruction: Patient instructed on dry needling rationale, procedures, and possible side effects including pain during treatment (achy,cramping feeling), bruising, drop of blood, lightheadedness, nausea, sweating. Patient Consent Given: Yes Education handout provided: No Muscles treated: R gastroc/soleus  Needle size and number: .25x20mm x 2 Electrical stimulation performed: No Parameters: N/A Treatment response/outcome: Twitch response elicited, Palpable decrease in muscle tension, and trace blood loss Post-treatment instructions: Patient instructed to expect possible mild to moderate muscle soreness later today and/or tomorrow. Patient instructed in methods to reduce muscle soreness and to continue prescribed HEP. If patient was dry needled over the lung field, patient was instructed on signs and symptoms of pneumothorax and, however unlikely, to see immediate medical attention should they occur. Patient was also educated on signs and symptoms of infection and to seek medical attention should they occur. Patient verbalized understanding of these instructions and education.                                                                                                                        DATE: 07/01/23 Eval and HEP   PATIENT EDUCATION:  Education details: Discussed eval findings, rehab rationale and POC and patient is in agreement  Person educated: Patient Education method: Explanation Education comprehension: verbalized understanding and needs further education  HOME  EXERCISE PROGRAM: Access Code: [email or web address] URL: [email or web address] Date: 07/01/2023 Prepared by: Gustavus Bryant  Exercises - Long Sitting Plantar Fascia Stretch with Towel  - 2 x daily - 5 x weekly - 1 sets - 2 reps - 30s hold - Soleus Stretch on Wall  - 2 x daily - 5 x weekly - 1 sets - 2 reps - 30s hold - Gastroc Stretch on Wall  - 2 x daily - 5 x weekly - 1 sets - 2 reps - 30s hold  ASSESSMENT:  CLINICAL IMPRESSION:  Pt was able to complete all prescribed exercises with no adverse effect. Therapy today continued to focus on decreasing pain, responded well to manual and TPDN. Incorporated eccentric loading to R heel today. Will continue to progress as able per POC.   (Eval)Patient is a 77 y.o. female who was seen today for physical therapy evaluation and treatment for R achilles tendinitis pain. Patient demonstrates only mild ROM deficits in R ankle as well as weakness in plantar flexion due to pain at achilles insertion site.  Palpation finds tenderness to achilles insertion site as well as R soleus muscle.  Patient is a good candidate for OPPT incorporating TPDN to relieve symptoms and regain function.  OBJECTIVE IMPAIRMENTS: Abnormal gait, decreased activity tolerance, decreased mobility, difficulty walking, decreased ROM, decreased strength, and pain.   ACTIVITY LIMITATIONS: standing, squatting, and stairs  PERSONAL FACTORS: Age, Fitness, and Time since onset of injury/illness/exacerbation are also affecting patient's functional outcome.   REHAB POTENTIAL: Good  CLINICAL DECISION MAKING: Stable/uncomplicated  EVALUATION  COMPLEXITY: Low   GOALS: Goals reviewed with patient? No  SHORT TERM GOALS: Target date: 07/22/2023   Patient to demonstrate independence in HEP  Baseline: [id number] Goal status: INITIAL   LONG TERM GOALS: Target date: 08/12/2023    Increase AROM R DF to 15d Baseline:  A/PROM Right eval Left eval  Hip flexion    Hip extension    Hip abduction    Hip adduction    Hip internal rotation    Hip external rotation    Knee flexion    Knee extension    Ankle dorsiflexion 10/18d    Goal status: INITIAL  2.  Increase R PF strength to 4+/5 Baseline:  MMT Right eval Left eval  Hip flexion    Hip extension    Hip abduction    Hip adduction    Hip internal rotation    Hip external rotation    Knee flexion    Knee extension    Ankle dorsiflexion 5   Ankle plantarflexion 4 P!   Ankle inversion 5   Ankle eversion 5    Goal status: INITIAL  3.  Increase LEFS score to 60/80 Baseline: 52/80 Goal status: INITIAL  4.  Decrease worst pain to 4/10 Baseline: 8/10 Goal status: INITIAL   PLAN:  PT FREQUENCY: 1-2x/week  PT DURATION: 6 weeks  PLANNED INTERVENTIONS: Therapeutic exercises, Therapeutic activity, Neuromuscular re-education, Balance training, Gait training, Patient/Family education, Self Care, Joint mobilization, Stair training, Aquatic Therapy, Dry Needling, Electrical stimulation, Cryotherapy, Moist heat, Manual therapy, and Re-evaluation  PLAN FOR NEXT SESSION: HEP review and update, manual techniques as appropriate, aerobic tasks, ROM and flexibility activities, strengthening and PREs, TPDN, gait and balance training as needed     Eloy End, PT 07/24/2023, 1:26 PM

## 2023-07-29 ENCOUNTER — Ambulatory Visit: Payer: Medicare Other

## 2023-07-29 DIAGNOSIS — M6281 Muscle weakness (generalized): Secondary | ICD-10-CM | POA: Diagnosis not present

## 2023-07-29 DIAGNOSIS — R2689 Other abnormalities of gait and mobility: Secondary | ICD-10-CM

## 2023-07-29 DIAGNOSIS — M7661 Achilles tendinitis, right leg: Secondary | ICD-10-CM

## 2023-07-29 NOTE — Therapy (Signed)
OUTPATIENT PHYSICAL THERAPY TREATMENT   Patient Name: Julie Duran MRN: [id number] DOB:Feb 18, 1946, 77 y.o., female Today's Date: 07/29/2023  END OF SESSION:  PT End of Session - 07/29/23 1314     Visit Number 6    Number of Visits 12    Date for PT Re-Evaluation 08/26/23    Authorization Type MCR    PT Start Time 1315    PT Stop Time 1355    PT Time Calculation (min) 40 min    Behavior During Therapy [facility] for tasks assessed/performed                  Past Medical History:  Diagnosis Date   Bronchitis    Depression    Fibromyalgia    Past Surgical History:  Procedure Laterality Date   HAND SURGERY Left 2008   HAND SURGERY Right 2012   TOTAL ABDOMINAL HYSTERECTOMY  1982   Patient Active Problem List   Diagnosis Date Noted   Community acquired pneumonia 10/26/2022   Sepsis (HCC) 10/26/2022   Reactive airway disease 10/26/2022    PCP: Merri Brunette, MD   REFERRING PROVIDER: Louann Sjogren, DPM  REFERRING DIAG: M76.61 (ICD-10-CM) - Tendonitis, Achilles, right  THERAPY DIAG:  Muscle weakness (generalized)  Achilles tendinitis of right lower extremity  Other abnormalities of gait and mobility  Rationale for Evaluation and Treatment: Rehabilitation  ONSET DATE: 3+ months  SUBJECTIVE:   SUBJECTIVE STATEMENT: Pt presents to PT with reports of improvement in status of R heel. Has been compliant with HEP with no adverse effect.   PERTINENT HISTORY: Patient was evaluated and treated and all questions answered. -Xrays reviewed. No acute fracture or dislocations. Mild spurring noted to posterior calcaneus.  -Discussed Achilles insertional tendonitis and treatment options with patient.  -Continue stretching exercises and heel lifts.  -Rx Meloxicam refilled.  -Discussed if no improvement will consider MRI/surgery/EPAT/PRP injections.  -Patient to return to office in 8-9 weeks.  PAIN:  Are you having pain?  Yes: NPRS scale: 8/10 Pain  location: R achilles Pain description: ache, sharp Aggravating factors: weightbearing tasks, walking, steps Relieving factors: rest  PRECAUTIONS: None  RED FLAGS: None   WEIGHT BEARING RESTRICTIONS: No  FALLS:  Has patient fallen in last 6 months? No  OCCUPATION: retired  PLOF: Independent  PATIENT GOALS: To manage my ankle symptoms  NEXT MD VISIT: 8 weeks  OBJECTIVE:   DIAGNOSTIC FINDINGS: none available  PATIENT SURVEYS:  LEFS 52/80  MUSCLE LENGTH: deferred  POSTURE: No Significant postural limitations  PALPATION: TTP R soleus muscle with active trigger point noted as well as insertion site of achilles tendon distally  LOWER EXTREMITY ROM:  A/PROM Right eval Left eval  Hip flexion    Hip extension    Hip abduction    Hip adduction    Hip internal rotation    Hip external rotation    Knee flexion    Knee extension    Ankle dorsiflexion 10/18d   Ankle plantarflexion WFL   Ankle inversion WFL   Ankle eversion WFL    (Blank rows = not tested)  LOWER EXTREMITY MMT:  MMT Right eval Left eval  Hip flexion    Hip extension    Hip abduction    Hip adduction    Hip internal rotation    Hip external rotation    Knee flexion    Knee extension    Ankle dorsiflexion 5   Ankle plantarflexion 4 P!   Ankle inversion 5   Ankle  eversion 5    (Blank rows = not tested)  LOWER EXTREMITY SPECIAL TESTS:  Ankle special tests: Anterior drawer test: negative and Talar tilt test: negative  FUNCTIONAL TESTS:  5 times sit to stand: 13s  GAIT: Distance walked: 52ft x2 Assistive device utilized: None Level of assistance: Complete Independence Comments: decreased R toe off   TODAY'S TREATMENT:         [facility] Adult PT Treatment:                                                DATE: 07/29/23 Therapeutic Exercise: Nustep L5 x 4 min while taking subjective Eccentric heel lowering (2 up, 1 down) 2x10 R 4in Slant board gastroc stretch 2x60" Slant board soleus  stretch 2x60" Heel raise with ball 2x15 Wobble board fwd/bwd x 15 Manual Therapy: Skilled palpation to identify taught bands in R medial/lateral gastroc head IASTM and STM to R calf Trigger Point Dry Needling Treatment: Pre-treatment instruction: Patient instructed on dry needling rationale, procedures, and possible side effects including pain during treatment (achy,cramping feeling), bruising, drop of blood, lightheadedness, nausea, sweating. Patient Consent Given: Yes Education handout provided: No Muscles treated: R gastroc/soleus  Needle size and number: .30x64mm x 2, .30x8mm x 2 Electrical stimulation performed: N/A N/A Treatment response/outcome: Twitch response elicited, Palpable decrease in muscle tension, and trace blood loss Post-treatment instructions: Patient instructed to expect possible mild to moderate muscle soreness later today and/or tomorrow. Patient instructed in methods to reduce muscle soreness and to continue prescribed HEP. If patient was dry needled over the lung field, patient was instructed on signs and symptoms of pneumothorax and, however unlikely, to see immediate medical attention should they occur. Patient was also educated on signs and symptoms of infection and to seek medical attention should they occur. Patient verbalized understanding of these instructions and education.   [facility] Adult PT Treatment:                                                DATE: 07/24/23 Therapeutic Exercise: Nustep L5 x 4 min while taking subjective Eccentric heel lowering (2 up, 1 down) 2x10 R Slant board gastroc stretch 2x60" Slant board soleus stretch 2x60" Heel raise with ball 2x15 Manual Therapy: Skilled palpation to identify taught bands in R medial/lateral gastroc head IASTM to R medial gastroc Trigger Point Dry Needling Treatment: Pre-treatment instruction: Patient instructed on dry needling rationale, procedures, and possible side effects including pain during treatment  (achy,cramping feeling), bruising, drop of blood, lightheadedness, nausea, sweating. Patient Consent Given: Yes Education handout provided: No Muscles treated: R gastroc/soleus  Needle size and number: .30x68mm x 2, .30x60mm x 2 Electrical stimulation performed: N/A N/A Treatment response/outcome: Twitch response elicited, Palpable decrease in muscle tension, and trace blood loss Post-treatment instructions: Patient instructed to expect possible mild to moderate muscle soreness later today and/or tomorrow. Patient instructed in methods to reduce muscle soreness and to continue prescribed HEP. If patient was dry needled over the lung field, patient was instructed on signs and symptoms of pneumothorax and, however unlikely, to see immediate medical attention should they occur. Patient was also educated on signs and symptoms of infection and to seek medical attention should they occur. Patient verbalized understanding  of these instructions and education.   [facility] Adult PT Treatment:                                                DATE: 07/22/23 Therapeutic Exercise: Nustep L5 x 3 min Standing gastroc stretch 2x30" R Eccentric heel lowering (2 up, 1 down) 2x10 R Manual Therapy: Skilled palpation to identify taught bands in R medial/lateral gastroc head STM to R calf Trigger Point Dry Needling Treatment: Pre-treatment instruction: Patient instructed on dry needling rationale, procedures, and possible side effects including pain during treatment (achy,cramping feeling), bruising, drop of blood, lightheadedness, nausea, sweating. Patient Consent Given: Yes Education handout provided: No Muscles treated: R gastroc/soleus  Needle size and number: .30x30mm x 4 Electrical stimulation performed: Yes Parameters: Mirco x 5 min (low freq, low intensity); Milli x 5 min (high freq, high intensity) Treatment response/outcome: Twitch response elicited, Palpable decrease in muscle tension, and trace blood  loss Post-treatment instructions: Patient instructed to expect possible mild to moderate muscle soreness later today and/or tomorrow. Patient instructed in methods to reduce muscle soreness and to continue prescribed HEP. If patient was dry needled over the lung field, patient was instructed on signs and symptoms of pneumothorax and, however unlikely, to see immediate medical attention should they occur. Patient was also educated on signs and symptoms of infection and to seek medical attention should they occur. Patient verbalized understanding of these instructions and education.   [facility] Adult PT Treatment:                                                DATE: 07/18/23 Therapeutic Exercise: Nustep L4 8 min PF against wall 15x DF against wall 15x Alternate march against wall 15/15 Inv/ev RTB 2x15 B Manual Therapy: Skilled palpation to identify taught bands in R medial gastroc head. Trigger Point Dry Needling Treatment: Pre-treatment instruction: Patient instructed on dry needling rationale, procedures, and possible side effects including pain during treatment (achy,cramping feeling), bruising, drop of blood, lightheadedness, nausea, sweating. Patient Consent Given: Yes Education handout provided: No Muscles treated: R gastroc/soleus  Needle size and number: .25x39mm x 2 Electrical stimulation performed: No Parameters: N/A Treatment response/outcome: Twitch response elicited, Palpable decrease in muscle tension, and trace blood loss Post-treatment instructions: Patient instructed to expect possible mild to moderate muscle soreness later today and/or tomorrow. Patient instructed in methods to reduce muscle soreness and to continue prescribed HEP. If patient was dry needled over the lung field, patient was instructed on signs and symptoms of pneumothorax and, however unlikely, to see immediate medical attention should they occur. Patient was also educated on signs and symptoms of infection and to seek  medical attention should they occur. Patient verbalized understanding of these instructions and education.    PATIENT EDUCATION:  Education details: Discussed eval findings, rehab rationale and POC and patient is in agreement  Person educated: Patient Education method: Explanation Education comprehension: verbalized understanding and needs further education  HOME EXERCISE PROGRAM: Access Code: [email or web address] URL: [email or web address] Date: 07/01/2023 Prepared by: Gustavus Bryant  Exercises - Long Sitting Plantar Fascia Stretch with Towel  - 2 x daily - 5 x weekly - 1 sets - 2 reps - 30s hold - Soleus Stretch on Wall  -  2 x daily - 5 x weekly - 1 sets - 2 reps - 30s hold - Gastroc Stretch on Wall  - 2 x daily - 5 x weekly - 1 sets - 2 reps - 30s hold  ASSESSMENT:  CLINICAL IMPRESSION:   Pt was able to complete all prescribed exercises with no adverse effect. Therapy today continued to focus on decreasing pain, responded well to manual and TPDN today and is progressing well with therapy. Will continue to progress as able per POC.   (Eval)Patient is a 77 y.o. female who was seen today for physical therapy evaluation and treatment for R achilles tendinitis pain. Patient demonstrates only mild ROM deficits in R ankle as well as weakness in plantar flexion due to pain at achilles insertion site.  Palpation finds tenderness to achilles insertion site as well as R soleus muscle.  Patient is a good candidate for OPPT incorporating TPDN to relieve symptoms and regain function.  OBJECTIVE IMPAIRMENTS: Abnormal gait, decreased activity tolerance, decreased mobility, difficulty walking, decreased ROM, decreased strength, and pain.   ACTIVITY LIMITATIONS: standing, squatting, and stairs  PERSONAL FACTORS: Age, Fitness, and Time since onset of injury/illness/exacerbation are also affecting patient's functional outcome.   REHAB POTENTIAL: Good  CLINICAL DECISION MAKING:  Stable/uncomplicated  EVALUATION COMPLEXITY: Low   GOALS: Goals reviewed with patient? No  SHORT TERM GOALS: Target date: 07/22/2023   Patient to demonstrate independence in HEP  Baseline: [id number] Goal status: INITIAL   LONG TERM GOALS: Target date: 08/12/2023    Increase AROM R DF to 15d Baseline:  A/PROM Right eval Left eval  Hip flexion    Hip extension    Hip abduction    Hip adduction    Hip internal rotation    Hip external rotation    Knee flexion    Knee extension    Ankle dorsiflexion 10/18d    Goal status: INITIAL  2.  Increase R PF strength to 4+/5 Baseline:  MMT Right eval Left eval  Hip flexion    Hip extension    Hip abduction    Hip adduction    Hip internal rotation    Hip external rotation    Knee flexion    Knee extension    Ankle dorsiflexion 5   Ankle plantarflexion 4 P!   Ankle inversion 5   Ankle eversion 5    Goal status: INITIAL  3.  Increase LEFS score to 60/80 Baseline: 52/80 Goal status: INITIAL  4.  Decrease worst pain to 4/10 Baseline: 8/10 Goal status: INITIAL   PLAN:  PT FREQUENCY: 1-2x/week  PT DURATION: 6 weeks  PLANNED INTERVENTIONS: Therapeutic exercises, Therapeutic activity, Neuromuscular re-education, Balance training, Gait training, Patient/Family education, Self Care, Joint mobilization, Stair training, Aquatic Therapy, Dry Needling, Electrical stimulation, Cryotherapy, Moist heat, Manual therapy, and Re-evaluation  PLAN FOR NEXT SESSION: HEP review and update, manual techniques as appropriate, aerobic tasks, ROM and flexibility activities, strengthening and PREs, TPDN, gait and balance training as needed     Eloy End, PT 07/29/2023, 1:58 PM

## 2023-07-30 NOTE — Therapy (Unsigned)
OUTPATIENT PHYSICAL THERAPY TREATMENT   Patient Name: Niveyah Phipps MRN: [id number] DOB:February 25, 1946, 77 y.o., female Today's Date: 07/31/2023  END OF SESSION:  PT End of Session - 07/31/23 1310     Visit Number 7    Number of Visits 12    Date for PT Re-Evaluation 08/26/23    Authorization Type MCR    PT Start Time 1315    PT Stop Time 1355    PT Time Calculation (min) 40 min    Activity Tolerance Patient tolerated treatment well    Behavior During Therapy [facility] for tasks assessed/performed                   Past Medical History:  Diagnosis Date   Bronchitis    Depression    Fibromyalgia    Past Surgical History:  Procedure Laterality Date   HAND SURGERY Left 2008   HAND SURGERY Right 2012   TOTAL ABDOMINAL HYSTERECTOMY  1982   Patient Active Problem List   Diagnosis Date Noted   Community acquired pneumonia 10/26/2022   Sepsis (HCC) 10/26/2022   Reactive airway disease 10/26/2022    PCP: Merri Brunette, MD   REFERRING PROVIDER: Louann Sjogren, DPM  REFERRING DIAG: M76.61 (ICD-10-CM) - Tendonitis, Achilles, right  THERAPY DIAG:  Muscle weakness (generalized)  Achilles tendinitis of right lower extremity  Other abnormalities of gait and mobility  Rationale for Evaluation and Treatment: Rehabilitation  ONSET DATE: 3+ months  SUBJECTIVE:   SUBJECTIVE STATEMENT: Overall better but still sore following last session in R calf.  Symptoms 6/10 in intensity today  PERTINENT HISTORY: Patient was evaluated and treated and all questions answered. -Xrays reviewed. No acute fracture or dislocations. Mild spurring noted to posterior calcaneus.  -Discussed Achilles insertional tendonitis and treatment options with patient.  -Continue stretching exercises and heel lifts.  -Rx Meloxicam refilled.  -Discussed if no improvement will consider MRI/surgery/EPAT/PRP injections.  -Patient to return to office in 8-9 weeks.  PAIN:  Are you having pain?   Yes: NPRS scale: 8/10; 07/31/23 6/10 Pain location: R achilles Pain description: ache, sharp Aggravating factors: weightbearing tasks, walking, steps Relieving factors: rest  PRECAUTIONS: None  RED FLAGS: None   WEIGHT BEARING RESTRICTIONS: No  FALLS:  Has patient fallen in last 6 months? No  OCCUPATION: retired  PLOF: Independent  PATIENT GOALS: To manage my ankle symptoms  NEXT MD VISIT: 8 weeks  OBJECTIVE:   DIAGNOSTIC FINDINGS: none available  PATIENT SURVEYS:  LEFS 52/80; 07/31/23   MUSCLE LENGTH: deferred  POSTURE: No Significant postural limitations  PALPATION: TTP R soleus muscle with active trigger point noted as well as insertion site of achilles tendon distally  LOWER EXTREMITY ROM:  A/PROM Right eval Left eval  Hip flexion    Hip extension    Hip abduction    Hip adduction    Hip internal rotation    Hip external rotation    Knee flexion    Knee extension    Ankle dorsiflexion 10/18d   Ankle plantarflexion WFL   Ankle inversion WFL   Ankle eversion WFL    (Blank rows = not tested)  LOWER EXTREMITY MMT:  MMT Right eval Left eval  Hip flexion    Hip extension    Hip abduction    Hip adduction    Hip internal rotation    Hip external rotation    Knee flexion    Knee extension    Ankle dorsiflexion 5   Ankle plantarflexion 4 P!  Ankle inversion 5   Ankle eversion 5    (Blank rows = not tested)  LOWER EXTREMITY SPECIAL TESTS:  Ankle special tests: Anterior drawer test: negative and Talar tilt test: negative  FUNCTIONAL TESTS:  5 times sit to stand: 13s  GAIT: Distance walked: 63ft x2 Assistive device utilized: None Level of assistance: Complete Independence Comments: decreased R toe off   TODAY'S TREATMENT:         [facility] Adult PT Treatment:                                                DATE: 07/31/23 Therapeutic Exercise: Nustep L5 8 min Runners step 6 in 15/15 Step up using RB 15x B Step downs from RB 15x B  RB  A/P tilts 60s BUE Support RB M/L tilts 60s BUE Support Manual Therapy:  Skilled palpation to identify taught and irritable bands in R medial gastroc head.  Pre-treatment instruction: Patient instructed on dry needling rationale, procedures, and possible side effects including pain during treatment (achy,cramping feeling), bruising, drop of blood, lightheadedness, nausea, sweating. Patient Consent Given: Yes Education handout provided: No Muscles treated: R gastroc/soleus  Needle size and number: 0.25x61mm Electrical stimulation performed: N/A N/A Treatment response/outcome: Twitch response elicited, Palpable decrease in muscle tension, and trace blood loss Post-treatment instructions: Patient instructed to expect possible mild to moderate muscle soreness later today and/or tomorrow. Patient instructed in methods to reduce muscle soreness and to continue prescribed HEP. If patient was dry needled over the lung field, patient was instructed on signs and symptoms of pneumothorax and, however unlikely, to see immediate medical attention should they occur. Patient was also educated on signs and symptoms of infection and to seek medical attention should they occur. Patient verbalized understanding of these instructions and education.   [facility] Adult PT Treatment:                                                DATE: 07/29/23 Therapeutic Exercise: Nustep L5 x 4 min while taking subjective Eccentric heel lowering (2 up, 1 down) 2x10 R 4in Slant board gastroc stretch 2x60" Slant board soleus stretch 2x60" Heel raise with ball 2x15 Wobble board fwd/bwd x 15 Manual Therapy: Skilled palpation to identify taught bands in R medial/lateral gastroc head IASTM and STM to R calf Trigger Point Dry Needling Treatment: Pre-treatment instruction: Patient instructed on dry needling rationale, procedures, and possible side effects including pain during treatment (achy,cramping feeling), bruising, drop of blood,  lightheadedness, nausea, sweating. Patient Consent Given: Yes Education handout provided: No Muscles treated: R gastroc/soleus  Needle size and number: .30x77mm x 2, .30x10mm x 2 Electrical stimulation performed: N/A N/A Treatment response/outcome: Twitch response elicited, Palpable decrease in muscle tension, and trace blood loss Post-treatment instructions: Patient instructed to expect possible mild to moderate muscle soreness later today and/or tomorrow. Patient instructed in methods to reduce muscle soreness and to continue prescribed HEP. If patient was dry needled over the lung field, patient was instructed on signs and symptoms of pneumothorax and, however unlikely, to see immediate medical attention should they occur. Patient was also educated on signs and symptoms of infection and to seek medical attention should they occur. Patient verbalized understanding of these instructions  and education.   [facility] Adult PT Treatment:                                                DATE: 07/24/23 Therapeutic Exercise: Nustep L5 x 4 min while taking subjective Eccentric heel lowering (2 up, 1 down) 2x10 R Slant board gastroc stretch 2x60" Slant board soleus stretch 2x60" Heel raise with ball 2x15 Manual Therapy: Skilled palpation to identify taught bands in R medial/lateral gastroc head IASTM to R medial gastroc Trigger Point Dry Needling Treatment: Pre-treatment instruction: Patient instructed on dry needling rationale, procedures, and possible side effects including pain during treatment (achy,cramping feeling), bruising, drop of blood, lightheadedness, nausea, sweating. Patient Consent Given: Yes Education handout provided: No Muscles treated: R gastroc/soleus  Needle size and number: .30x75mm x 2, .30x38mm x 2 Electrical stimulation performed: N/A N/A Treatment response/outcome: Twitch response elicited, Palpable decrease in muscle tension, and trace blood loss Post-treatment instructions:  Patient instructed to expect possible mild to moderate muscle soreness later today and/or tomorrow. Patient instructed in methods to reduce muscle soreness and to continue prescribed HEP. If patient was dry needled over the lung field, patient was instructed on signs and symptoms of pneumothorax and, however unlikely, to see immediate medical attention should they occur. Patient was also educated on signs and symptoms of infection and to seek medical attention should they occur. Patient verbalized understanding of these instructions and education.   [facility] Adult PT Treatment:                                                DATE: 07/22/23 Therapeutic Exercise: Nustep L5 x 3 min Standing gastroc stretch 2x30" R Eccentric heel lowering (2 up, 1 down) 2x10 R Manual Therapy: Skilled palpation to identify taught bands in R medial/lateral gastroc head STM to R calf Trigger Point Dry Needling Treatment: Pre-treatment instruction: Patient instructed on dry needling rationale, procedures, and possible side effects including pain during treatment (achy,cramping feeling), bruising, drop of blood, lightheadedness, nausea, sweating. Patient Consent Given: Yes Education handout provided: No Muscles treated: R gastroc/soleus  Needle size and number: .30x58mm x 4 Electrical stimulation performed: Yes Parameters: Mirco x 5 min (low freq, low intensity); Milli x 5 min (high freq, high intensity) Treatment response/outcome: Twitch response elicited, Palpable decrease in muscle tension, and trace blood loss Post-treatment instructions: Patient instructed to expect possible mild to moderate muscle soreness later today and/or tomorrow. Patient instructed in methods to reduce muscle soreness and to continue prescribed HEP. If patient was dry needled over the lung field, patient was instructed on signs and symptoms of pneumothorax and, however unlikely, to see immediate medical attention should they occur. Patient was also  educated on signs and symptoms of infection and to seek medical attention should they occur. Patient verbalized understanding of these instructions and education.   [facility] Adult PT Treatment:                                                DATE: 07/18/23 Therapeutic Exercise: Nustep L4 8 min PF against wall 15x DF against wall 15x  Alternate march against wall 15/15 Inv/ev RTB 2x15 B Manual Therapy: Skilled palpation to identify taught bands in R medial gastroc head. Trigger Point Dry Needling Treatment: Pre-treatment instruction: Patient instructed on dry needling rationale, procedures, and possible side effects including pain during treatment (achy,cramping feeling), bruising, drop of blood, lightheadedness, nausea, sweating. Patient Consent Given: Yes Education handout provided: No Muscles treated: R gastroc/soleus  Needle size and number: .25x36mm x 2 Electrical stimulation performed: No Parameters: N/A Treatment response/outcome: Twitch response elicited, Palpable decrease in muscle tension, and trace blood loss Post-treatment instructions: Patient instructed to expect possible mild to moderate muscle soreness later today and/or tomorrow. Patient instructed in methods to reduce muscle soreness and to continue prescribed HEP. If patient was dry needled over the lung field, patient was instructed on signs and symptoms of pneumothorax and, however unlikely, to see immediate medical attention should they occur. Patient was also educated on signs and symptoms of infection and to seek medical attention should they occur. Patient verbalized understanding of these instructions and education.    PATIENT EDUCATION:  Education details: Discussed eval findings, rehab rationale and POC and patient is in agreement  Person educated: Patient Education method: Explanation Education comprehension: verbalized understanding and needs further education  HOME EXERCISE PROGRAM: Access Code: [email or web address] URL:  [email or web address] Date: 07/01/2023 Prepared by: Gustavus Bryant  Exercises - Long Sitting Plantar Fascia Stretch with Towel  - 2 x daily - 5 x weekly - 1 sets - 2 reps - 30s hold - Soleus Stretch on Wall  - 2 x daily - 5 x weekly - 1 sets - 2 reps - 30s hold - Gastroc Stretch on Wall  - 2 x daily - 5 x weekly - 1 sets - 2 reps - 30s hold  ASSESSMENT:  CLINICAL IMPRESSION:  Continued TPDN technique but limiting scope to R medial gastroc head only using single needle.  Added RB tasks to incorporate eccentric strength and stretch.   (Eval)Patient is a 77 y.o. female who was seen today for physical therapy evaluation and treatment for R achilles tendinitis pain. Patient demonstrates only mild ROM deficits in R ankle as well as weakness in plantar flexion due to pain at achilles insertion site.  Palpation finds tenderness to achilles insertion site as well as R soleus muscle.  Patient is a good candidate for OPPT incorporating TPDN to relieve symptoms and regain function.  OBJECTIVE IMPAIRMENTS: Abnormal gait, decreased activity tolerance, decreased mobility, difficulty walking, decreased ROM, decreased strength, and pain.   ACTIVITY LIMITATIONS: standing, squatting, and stairs  PERSONAL FACTORS: Age, Fitness, and Time since onset of injury/illness/exacerbation are also affecting patient's functional outcome.   REHAB POTENTIAL: Good  CLINICAL DECISION MAKING: Stable/uncomplicated  EVALUATION COMPLEXITY: Low   GOALS: Goals reviewed with patient? No  SHORT TERM GOALS: Target date: 07/22/2023   Patient to demonstrate independence in HEP  Baseline: [id number] Goal status: INITIAL   LONG TERM GOALS: Target date: 08/12/2023    Increase AROM R DF to 15d Baseline:  A/PROM Right eval Left eval  Hip flexion    Hip extension    Hip abduction    Hip adduction    Hip internal rotation    Hip external rotation    Knee flexion    Knee extension    Ankle dorsiflexion  10/18d    Goal status: INITIAL  2.  Increase R PF strength to 4+/5 Baseline:  MMT Right eval Left eval  Hip flexion    Hip extension    Hip  abduction    Hip adduction    Hip internal rotation    Hip external rotation    Knee flexion    Knee extension    Ankle dorsiflexion 5   Ankle plantarflexion 4 P!   Ankle inversion 5   Ankle eversion 5    Goal status: INITIAL  3.  Increase LEFS score to 60/80 Baseline: 52/80 Goal status: INITIAL  4.  Decrease worst pain to 4/10 Baseline: 8/10 Goal status: INITIAL   PLAN:  PT FREQUENCY: 1-2x/week  PT DURATION: 6 weeks  PLANNED INTERVENTIONS: Therapeutic exercises, Therapeutic activity, Neuromuscular re-education, Balance training, Gait training, Patient/Family education, Self Care, Joint mobilization, Stair training, Aquatic Therapy, Dry Needling, Electrical stimulation, Cryotherapy, Moist heat, Manual therapy, and Re-evaluation  PLAN FOR NEXT SESSION: HEP review and update, manual techniques as appropriate, aerobic tasks, ROM and flexibility activities, strengthening and PREs, TPDN, gait and balance training as needed     Hildred Laser, PT 07/31/2023, 1:59 PM

## 2023-07-31 ENCOUNTER — Ambulatory Visit: Payer: Medicare Other

## 2023-07-31 DIAGNOSIS — M7661 Achilles tendinitis, right leg: Secondary | ICD-10-CM

## 2023-07-31 DIAGNOSIS — M6281 Muscle weakness (generalized): Secondary | ICD-10-CM

## 2023-07-31 DIAGNOSIS — R2689 Other abnormalities of gait and mobility: Secondary | ICD-10-CM

## 2023-08-05 ENCOUNTER — Ambulatory Visit: Payer: Medicare Other | Attending: Podiatry

## 2023-08-05 DIAGNOSIS — M6281 Muscle weakness (generalized): Secondary | ICD-10-CM | POA: Diagnosis not present

## 2023-08-05 DIAGNOSIS — M7661 Achilles tendinitis, right leg: Secondary | ICD-10-CM | POA: Insufficient documentation

## 2023-08-05 DIAGNOSIS — R2689 Other abnormalities of gait and mobility: Secondary | ICD-10-CM | POA: Insufficient documentation

## 2023-08-05 NOTE — Therapy (Signed)
OUTPATIENT PHYSICAL THERAPY TREATMENT   Patient Name: Julie Duran MRN: [id number] DOB:07-May-1946, 77 y.o., female Today's Date: 08/05/2023  END OF SESSION:  PT End of Session - 08/05/23 1311     Visit Number 8    Number of Visits 12    Date for PT Re-Evaluation 08/26/23    Authorization Type MCR    PT Start Time 1315    PT Stop Time 1354    PT Time Calculation (min) 39 min    Activity Tolerance Patient tolerated treatment well    Behavior During Therapy [facility] for tasks assessed/performed                    Past Medical History:  Diagnosis Date   Bronchitis    Depression    Fibromyalgia    Past Surgical History:  Procedure Laterality Date   HAND SURGERY Left 2008   HAND SURGERY Right 2012   TOTAL ABDOMINAL HYSTERECTOMY  1982   Patient Active Problem List   Diagnosis Date Noted   Community acquired pneumonia 10/26/2022   Sepsis (HCC) 10/26/2022   Reactive airway disease 10/26/2022    PCP: Merri Brunette, MD   REFERRING PROVIDER: Louann Sjogren, DPM  REFERRING DIAG: M76.61 (ICD-10-CM) - Tendonitis, Achilles, right  THERAPY DIAG:  Muscle weakness (generalized)  Achilles tendinitis of right lower extremity  Other abnormalities of gait and mobility  Rationale for Evaluation and Treatment: Rehabilitation  ONSET DATE: 3+ months  SUBJECTIVE:   SUBJECTIVE STATEMENT: Pt presents to PT with reports of continued pain. Has some swelling in R medial ankle, believes it is from the exercises. Did not complete HEP secondary to pain over the weekend.   PERTINENT HISTORY: Patient was evaluated and treated and all questions answered. -Xrays reviewed. No acute fracture or dislocations. Mild spurring noted to posterior calcaneus.  -Discussed Achilles insertional tendonitis and treatment options with patient.  -Continue stretching exercises and heel lifts.  -Rx Meloxicam refilled.  -Discussed if no improvement will consider MRI/surgery/EPAT/PRP  injections.  -Patient to return to office in 8-9 weeks.   PAIN:  Are you having pain?  Yes: NPRS scale: 6/10 Pain location: R achilles Pain description: ache, sharp Aggravating factors: weightbearing tasks, walking, steps Relieving factors: rest  PRECAUTIONS: None  RED FLAGS: None   WEIGHT BEARING RESTRICTIONS: No  FALLS:  Has patient fallen in last 6 months? No  OCCUPATION: retired  PLOF: Independent  PATIENT GOALS: To manage my ankle symptoms  NEXT MD VISIT: 8 weeks  OBJECTIVE:   DIAGNOSTIC FINDINGS: none available  PATIENT SURVEYS:  LEFS 52/80; 07/31/23   MUSCLE LENGTH: deferred  POSTURE: No Significant postural limitations  PALPATION: TTP R soleus muscle with active trigger point noted as well as insertion site of achilles tendon distally  LOWER EXTREMITY ROM:  A/PROM Right eval Left eval  Hip flexion    Hip extension    Hip abduction    Hip adduction    Hip internal rotation    Hip external rotation    Knee flexion    Knee extension    Ankle dorsiflexion 10/18d   Ankle plantarflexion WFL   Ankle inversion WFL   Ankle eversion WFL    (Blank rows = not tested)  LOWER EXTREMITY MMT:  MMT Right eval Left eval  Hip flexion    Hip extension    Hip abduction    Hip adduction    Hip internal rotation    Hip external rotation    Knee flexion  Knee extension    Ankle dorsiflexion 5   Ankle plantarflexion 4 P!   Ankle inversion 5   Ankle eversion 5    (Blank rows = not tested)  LOWER EXTREMITY SPECIAL TESTS:  Ankle special tests: Anterior drawer test: negative and Talar tilt test: negative  FUNCTIONAL TESTS:  5 times sit to stand: 13s  GAIT: Distance walked: 36ft x2 Assistive device utilized: None Level of assistance: Complete Independence Comments: decreased R toe off   TODAY'S TREATMENT:         [facility] Adult PT Treatment:                                                DATE: 08/05/23 Therapeutic Exercise: Nustep L5 4 min while  taking subjective Slant board gastroc stretch x 45" Slant board soleus stretch x 45"  Eccentric heel lowering 2 up 1 down RLE 2x10 2in  RB A/P tilts 2x60" BUE Support Tandem stance 2x30" each Step ups 2x10 - 8in Manual Therapy: Skilled palpation to identify taught and irritable bands in R medial gastroc head.  STM to R calf Trigger Point Dry Needling: Pre-treatment instruction: Patient instructed on dry needling rationale, procedures, and possible side effects including pain during treatment (achy,cramping feeling), bruising, drop of blood, lightheadedness, nausea, sweating. Patient Consent Given: Yes Education handout provided: No Muscles treated: R gastroc/soleus  Needle size and number: 0.25x42mm Electrical stimulation performed: N/A N/A Treatment response/outcome: Twitch response elicited, Palpable decrease in muscle tension, and trace blood loss Post-treatment instructions: Patient instructed to expect possible mild to moderate muscle soreness later today and/or tomorrow. Patient instructed in methods to reduce muscle soreness and to continue prescribed HEP. If patient was dry needled over the lung field, patient was instructed on signs and symptoms of pneumothorax and, however unlikely, to see immediate medical attention should they occur. Patient was also educated on signs and symptoms of infection and to seek medical attention should they occur. Patient verbalized understanding of these instructions and education.  [facility] Adult PT Treatment:                                                DATE: 07/31/23 Therapeutic Exercise: Nustep L5 8 min Runners step 6 in 15/15 Step up using RB 15x B Step downs from RB 15x B  RB A/P tilts 60s BUE Support RB M/L tilts 60s BUE Support Manual Therapy:  Skilled palpation to identify taught and irritable bands in R medial gastroc head.  Pre-treatment instruction: Patient instructed on dry needling rationale, procedures, and possible side effects  including pain during treatment (achy,cramping feeling), bruising, drop of blood, lightheadedness, nausea, sweating. Patient Consent Given: Yes Education handout provided: No Muscles treated: R gastroc/soleus  Needle size and number: 0.25x40mm Electrical stimulation performed: N/A N/A Treatment response/outcome: Twitch response elicited, Palpable decrease in muscle tension, and trace blood loss Post-treatment instructions: Patient instructed to expect possible mild to moderate muscle soreness later today and/or tomorrow. Patient instructed in methods to reduce muscle soreness and to continue prescribed HEP. If patient was dry needled over the lung field, patient was instructed on signs and symptoms of pneumothorax and, however unlikely, to see immediate medical attention should they occur. Patient was also educated on signs  and symptoms of infection and to seek medical attention should they occur. Patient verbalized understanding of these instructions and education.   [facility] Adult PT Treatment:                                                DATE: 07/29/23 Therapeutic Exercise: Nustep L5 x 4 min while taking subjective Eccentric heel lowering (2 up, 1 down) 2x10 R 4in Slant board gastroc stretch 2x60" Slant board soleus stretch 2x60" Heel raise with ball 2x15 Wobble board fwd/bwd x 15 Manual Therapy: Skilled palpation to identify taught bands in R medial/lateral gastroc head IASTM and STM to R calf Trigger Point Dry Needling Treatment: Pre-treatment instruction: Patient instructed on dry needling rationale, procedures, and possible side effects including pain during treatment (achy,cramping feeling), bruising, drop of blood, lightheadedness, nausea, sweating. Patient Consent Given: Yes Education handout provided: No Muscles treated: R gastroc/soleus  Needle size and number: .30x33mm x 2, .30x63mm x 2 Electrical stimulation performed: N/A N/A Treatment response/outcome: Twitch response  elicited, Palpable decrease in muscle tension, and trace blood loss Post-treatment instructions: Patient instructed to expect possible mild to moderate muscle soreness later today and/or tomorrow. Patient instructed in methods to reduce muscle soreness and to continue prescribed HEP. If patient was dry needled over the lung field, patient was instructed on signs and symptoms of pneumothorax and, however unlikely, to see immediate medical attention should they occur. Patient was also educated on signs and symptoms of infection and to seek medical attention should they occur. Patient verbalized understanding of these instructions and education.   [facility] Adult PT Treatment:                                                DATE: 07/24/23 Therapeutic Exercise: Nustep L5 x 4 min while taking subjective Eccentric heel lowering (2 up, 1 down) 2x10 R Slant board gastroc stretch 2x60" Slant board soleus stretch 2x60" Heel raise with ball 2x15 Manual Therapy: Skilled palpation to identify taught bands in R medial/lateral gastroc head IASTM to R medial gastroc Trigger Point Dry Needling Treatment: Pre-treatment instruction: Patient instructed on dry needling rationale, procedures, and possible side effects including pain during treatment (achy,cramping feeling), bruising, drop of blood, lightheadedness, nausea, sweating. Patient Consent Given: Yes Education handout provided: No Muscles treated: R gastroc/soleus  Needle size and number: .30x13mm x 2, .30x67mm x 2 Electrical stimulation performed: N/A N/A Treatment response/outcome: Twitch response elicited, Palpable decrease in muscle tension, and trace blood loss Post-treatment instructions: Patient instructed to expect possible mild to moderate muscle soreness later today and/or tomorrow. Patient instructed in methods to reduce muscle soreness and to continue prescribed HEP. If patient was dry needled over the lung field, patient was instructed on signs and  symptoms of pneumothorax and, however unlikely, to see immediate medical attention should they occur. Patient was also educated on signs and symptoms of infection and to seek medical attention should they occur. Patient verbalized understanding of these instructions and education.   PATIENT EDUCATION:  Education details: Discussed eval findings, rehab rationale and POC and patient is in agreement  Person educated: Patient Education method: Explanation Education comprehension: verbalized understanding and needs further education  HOME EXERCISE PROGRAM: Access Code: [email or web address] URL: [email or web address]  Date: 07/01/2023 Prepared by: Gustavus Bryant  Exercises - Long Sitting Plantar Fascia Stretch with Towel  - 2 x daily - 5 x weekly - 1 sets - 2 reps - 30s hold - Soleus Stretch on Wall  - 2 x daily - 5 x weekly - 1 sets - 2 reps - 30s hold - Gastroc Stretch on Wall  - 2 x daily - 5 x weekly - 1 sets - 2 reps - 30s hold  ASSESSMENT:  CLINICAL IMPRESSION:  Pt was able to complete all prescribed exercises with no adverse effect. Therapy today continued to focus on decreasing pain with eccentric loading and stretching, responded well to manual and TPDN today. Will continue to progress as able per POC.   (Eval)Patient is a 77 y.o. female who was seen today for physical therapy evaluation and treatment for R achilles tendinitis pain. Patient demonstrates only mild ROM deficits in R ankle as well as weakness in plantar flexion due to pain at achilles insertion site.  Palpation finds tenderness to achilles insertion site as well as R soleus muscle.  Patient is a good candidate for OPPT incorporating TPDN to relieve symptoms and regain function.  OBJECTIVE IMPAIRMENTS: Abnormal gait, decreased activity tolerance, decreased mobility, difficulty walking, decreased ROM, decreased strength, and pain.   ACTIVITY LIMITATIONS: standing, squatting, and stairs  PERSONAL FACTORS: Age, Fitness, and  Time since onset of injury/illness/exacerbation are also affecting patient's functional outcome.   REHAB POTENTIAL: Good  CLINICAL DECISION MAKING: Stable/uncomplicated  EVALUATION COMPLEXITY: Low   GOALS: Goals reviewed with patient? No  SHORT TERM GOALS: Target date: 07/22/2023   Patient to demonstrate independence in HEP  Baseline: [id number] Goal status: INITIAL   LONG TERM GOALS: Target date: 08/12/2023    Increase AROM R DF to 15d Baseline:  A/PROM Right eval Left eval  Hip flexion    Hip extension    Hip abduction    Hip adduction    Hip internal rotation    Hip external rotation    Knee flexion    Knee extension    Ankle dorsiflexion 10/18d    Goal status: INITIAL  2.  Increase R PF strength to 4+/5 Baseline:  MMT Right eval Left eval  Hip flexion    Hip extension    Hip abduction    Hip adduction    Hip internal rotation    Hip external rotation    Knee flexion    Knee extension    Ankle dorsiflexion 5   Ankle plantarflexion 4 P!   Ankle inversion 5   Ankle eversion 5    Goal status: INITIAL  3.  Increase LEFS score to 60/80 Baseline: 52/80 Goal status: INITIAL  4.  Decrease worst pain to 4/10 Baseline: 8/10 Goal status: INITIAL   PLAN:  PT FREQUENCY: 1-2x/week  PT DURATION: 6 weeks  PLANNED INTERVENTIONS: Therapeutic exercises, Therapeutic activity, Neuromuscular re-education, Balance training, Gait training, Patient/Family education, Self Care, Joint mobilization, Stair training, Aquatic Therapy, Dry Needling, Electrical stimulation, Cryotherapy, Moist heat, Manual therapy, and Re-evaluation  PLAN FOR NEXT SESSION: HEP review and update, manual techniques as appropriate, aerobic tasks, ROM and flexibility activities, strengthening and PREs, TPDN, gait and balance training as needed     Eloy End, PT 08/05/2023, 1:58 PM

## 2023-08-07 ENCOUNTER — Ambulatory Visit: Payer: Medicare Other

## 2023-08-07 DIAGNOSIS — M6281 Muscle weakness (generalized): Secondary | ICD-10-CM

## 2023-08-07 DIAGNOSIS — R2689 Other abnormalities of gait and mobility: Secondary | ICD-10-CM

## 2023-08-07 DIAGNOSIS — M7661 Achilles tendinitis, right leg: Secondary | ICD-10-CM | POA: Diagnosis not present

## 2023-08-07 NOTE — Therapy (Signed)
OUTPATIENT PHYSICAL THERAPY TREATMENT   Patient Name: Julie Duran MRN: [id number] DOB:1946-06-24, 77 y.o., female Today's Date: 08/07/2023  END OF SESSION:  PT End of Session - 08/07/23 1313     Visit Number 9    Number of Visits 12    Date for PT Re-Evaluation 08/26/23    Authorization Type MCR    PT Start Time 1315    PT Stop Time 1353    PT Time Calculation (min) 38 min    Activity Tolerance Patient tolerated treatment well    Behavior During Therapy  [facility] for tasks assessed/performed                     Past Medical History:  Diagnosis Date   Bronchitis    Depression    Fibromyalgia    Past Surgical History:  Procedure Laterality Date   HAND SURGERY Left 2008   HAND SURGERY Right 2012   TOTAL ABDOMINAL HYSTERECTOMY  1982   Patient Active Problem List   Diagnosis Date Noted   Community acquired pneumonia 10/26/2022   Sepsis (HCC) 10/26/2022   Reactive airway disease 10/26/2022    PCP: Merri Brunette, MD   REFERRING PROVIDER: Louann Sjogren, DPM  REFERRING DIAG: M76.61 (ICD-10-CM) - Tendonitis, Achilles, right  THERAPY DIAG:  Muscle weakness (generalized)  Other abnormalities of gait and mobility  Rationale for Evaluation and Treatment: Rehabilitation  ONSET DATE: 3+ months  SUBJECTIVE:   SUBJECTIVE STATEMENT: Pt presents to PT today noting a lot of improvement since last session with decreased pain today. Has been compliant with HEP with no adverse effect over last two days.   PERTINENT HISTORY: See PMH  PAIN:  Are you having pain?  Yes: NPRS scale: 1/10 Pain location: R achilles Pain description: ache, sharp Aggravating factors: weightbearing tasks, walking, steps Relieving factors: rest  PRECAUTIONS: None  RED FLAGS: None   WEIGHT BEARING RESTRICTIONS: No  FALLS:  Has patient fallen in last 6 months? No  OCCUPATION: retired  PLOF: Independent  PATIENT GOALS: To manage my ankle symptoms  NEXT MD VISIT:  8 weeks  OBJECTIVE:   DIAGNOSTIC FINDINGS: none available  PATIENT SURVEYS:  LEFS 52/80; 07/31/23   MUSCLE LENGTH: deferred  POSTURE: No Significant postural limitations  PALPATION: TTP R soleus muscle with active trigger point noted as well as insertion site of achilles tendon distally  LOWER EXTREMITY ROM:  A/PROM Right eval Left eval  Hip flexion    Hip extension    Hip abduction    Hip adduction    Hip internal rotation    Hip external rotation    Knee flexion    Knee extension    Ankle dorsiflexion 10/18d   Ankle plantarflexion WFL   Ankle inversion WFL   Ankle eversion WFL    (Blank rows = not tested)  LOWER EXTREMITY MMT:  MMT Right eval Left eval  Hip flexion    Hip extension    Hip abduction    Hip adduction    Hip internal rotation    Hip external rotation    Knee flexion    Knee extension    Ankle dorsiflexion 5   Ankle plantarflexion 4 P!   Ankle inversion 5   Ankle eversion 5    (Blank rows = not tested)  LOWER EXTREMITY SPECIAL TESTS:  Ankle special tests: Anterior drawer test: negative and Talar tilt test: negative  FUNCTIONAL TESTS:  5 times sit to stand: 13s  GAIT: Distance walked: 2ft x2  Assistive device utilized: None Level of assistance: Complete Independence Comments: decreased R toe off   TODAY'S TREATMENT:         [facility] Adult PT Treatment:                                                DATE: 08/07/23 Therapeutic Exercise: Nustep L5 4 min while taking subjective Slant board gastroc stretch x 45" Slant board soleus stretch x 45" Standing calf stretch 2x30" R Eccentric heel lowering 2 up 1 down RLE 2x10 2in  RB A/P tilts 2x60" BUE Support Tandem stance 2x30" each Tandem stance on foam 2x30" each Step ups 2x10 - 8in Manual Therapy: Skilled palpation to identify taught and irritable bands in R medial gastroc head.  STM to R calf Trigger Point Dry Needling: Pre-treatment instruction: Patient instructed on dry needling  rationale, procedures, and possible side effects including pain during treatment (achy,cramping feeling), bruising, drop of blood, lightheadedness, nausea, sweating. Patient Consent Given: Yes Education handout provided: No Muscles treated: R gastroc/soleus  Needle size and number: 0.25x1mm Electrical stimulation performed: N/A N/A Treatment response/outcome: Twitch response elicited, Palpable decrease in muscle tension, and trace blood loss Post-treatment instructions: Patient instructed to expect possible mild to moderate muscle soreness later today and/or tomorrow. Patient instructed in methods to reduce muscle soreness and to continue prescribed HEP. If patient was dry needled over the lung field, patient was instructed on signs and symptoms of pneumothorax and, however unlikely, to see immediate medical attention should they occur. Patient was also educated on signs and symptoms of infection and to seek medical attention should they occur. Patient verbalized understanding of these instructions and education.  [facility] Adult PT Treatment:                                                DATE: 08/05/23 Therapeutic Exercise: Nustep L5 4 min while taking subjective Slant board gastroc stretch x 45" Slant board soleus stretch x 45"  Eccentric heel lowering 2 up 1 down RLE 2x10 2in  RB A/P tilts 2x60" BUE Support Tandem stance 2x30" each Step ups 2x10 - 8in Manual Therapy: Skilled palpation to identify taught and irritable bands in R medial gastroc head.  STM to R calf Trigger Point Dry Needling: Pre-treatment instruction: Patient instructed on dry needling rationale, procedures, and possible side effects including pain during treatment (achy,cramping feeling), bruising, drop of blood, lightheadedness, nausea, sweating. Patient Consent Given: Yes Education handout provided: No Muscles treated: R gastroc/soleus  Needle size and number: 0.25x56mm Electrical stimulation performed:  N/A N/A Treatment response/outcome: Twitch response elicited, Palpable decrease in muscle tension, and trace blood loss Post-treatment instructions: Patient instructed to expect possible mild to moderate muscle soreness later today and/or tomorrow. Patient instructed in methods to reduce muscle soreness and to continue prescribed HEP. If patient was dry needled over the lung field, patient was instructed on signs and symptoms of pneumothorax and, however unlikely, to see immediate medical attention should they occur. Patient was also educated on signs and symptoms of infection and to seek medical attention should they occur. Patient verbalized understanding of these instructions and education.  [facility] Adult PT Treatment:  DATE: 07/31/23 Therapeutic Exercise: Nustep L5 8 min Runners step 6 in 15/15 Step up using RB 15x B Step downs from RB 15x B  RB A/P tilts 60s BUE Support RB M/L tilts 60s BUE Support Manual Therapy:  Skilled palpation to identify taught and irritable bands in R medial gastroc head.  Pre-treatment instruction: Patient instructed on dry needling rationale, procedures, and possible side effects including pain during treatment (achy,cramping feeling), bruising, drop of blood, lightheadedness, nausea, sweating. Patient Consent Given: Yes Education handout provided: No Muscles treated: R gastroc/soleus  Needle size and number: 0.25x57mm Electrical stimulation performed: N/A N/A Treatment response/outcome: Twitch response elicited, Palpable decrease in muscle tension, and trace blood loss Post-treatment instructions: Patient instructed to expect possible mild to moderate muscle soreness later today and/or tomorrow. Patient instructed in methods to reduce muscle soreness and to continue prescribed HEP. If patient was dry needled over the lung field, patient was instructed on signs and symptoms of pneumothorax and, however unlikely, to see  immediate medical attention should they occur. Patient was also educated on signs and symptoms of infection and to seek medical attention should they occur. Patient verbalized understanding of these instructions and education.   [facility] Adult PT Treatment:                                                DATE: 07/29/23 Therapeutic Exercise: Nustep L5 x 4 min while taking subjective Eccentric heel lowering (2 up, 1 down) 2x10 R 4in Slant board gastroc stretch 2x60" Slant board soleus stretch 2x60" Heel raise with ball 2x15 Wobble board fwd/bwd x 15 Manual Therapy: Skilled palpation to identify taught bands in R medial/lateral gastroc head IASTM and STM to R calf Trigger Point Dry Needling Treatment: Pre-treatment instruction: Patient instructed on dry needling rationale, procedures, and possible side effects including pain during treatment (achy,cramping feeling), bruising, drop of blood, lightheadedness, nausea, sweating. Patient Consent Given: Yes Education handout provided: No Muscles treated: R gastroc/soleus  Needle size and number: .30x53mm x 2, .30x52mm x 2 Electrical stimulation performed: N/A N/A Treatment response/outcome: Twitch response elicited, Palpable decrease in muscle tension, and trace blood loss Post-treatment instructions: Patient instructed to expect possible mild to moderate muscle soreness later today and/or tomorrow. Patient instructed in methods to reduce muscle soreness and to continue prescribed HEP. If patient was dry needled over the lung field, patient was instructed on signs and symptoms of pneumothorax and, however unlikely, to see immediate medical attention should they occur. Patient was also educated on signs and symptoms of infection and to seek medical attention should they occur. Patient verbalized understanding of these instructions and education.   [facility] Adult PT Treatment:                                                DATE: 07/24/23 Therapeutic  Exercise: Nustep L5 x 4 min while taking subjective Eccentric heel lowering (2 up, 1 down) 2x10 R Slant board gastroc stretch 2x60" Slant board soleus stretch 2x60" Heel raise with ball 2x15 Manual Therapy: Skilled palpation to identify taught bands in R medial/lateral gastroc head IASTM to R medial gastroc Trigger Point Dry Needling Treatment: Pre-treatment instruction: Patient instructed on dry needling rationale, procedures, and possible side effects  including pain during treatment (achy,cramping feeling), bruising, drop of blood, lightheadedness, nausea, sweating. Patient Consent Given: Yes Education handout provided: No Muscles treated: R gastroc/soleus  Needle size and number: .30x31mm x 2, .30x38mm x 2 Electrical stimulation performed: N/A N/A Treatment response/outcome: Twitch response elicited, Palpable decrease in muscle tension, and trace blood loss Post-treatment instructions: Patient instructed to expect possible mild to moderate muscle soreness later today and/or tomorrow. Patient instructed in methods to reduce muscle soreness and to continue prescribed HEP. If patient was dry needled over the lung field, patient was instructed on signs and symptoms of pneumothorax and, however unlikely, to see immediate medical attention should they occur. Patient was also educated on signs and symptoms of infection and to seek medical attention should they occur. Patient verbalized understanding of these instructions and education.   PATIENT EDUCATION:  Education details: Discussed eval findings, rehab rationale and POC and patient is in agreement  Person educated: Patient Education method: Explanation Education comprehension: verbalized understanding and needs further education  HOME EXERCISE PROGRAM: Access Code: [email or web address] URL: [email or web address] Date: 07/01/2023 Prepared by: Gustavus Bryant  Exercises - Long Sitting Plantar Fascia Stretch with Towel  - 2 x daily - 5  x weekly - 1 sets - 2 reps - 30s hold - Soleus Stretch on Wall  - 2 x daily - 5 x weekly - 1 sets - 2 reps - 30s hold - Gastroc Stretch on Wall  - 2 x daily - 5 x weekly - 1 sets - 2 reps - 30s hold  ASSESSMENT:  CLINICAL IMPRESSION:   Pt was able to complete all prescribed exercises with improved tolerance and no adverse effect. Therapy today continued to focus on decreasing pain with eccentric loading and stretching, responded well to manual and TPDN once again. Will continue to progress as able per POC.   (Eval)Patient is a 77 y.o. female who was seen today for physical therapy evaluation and treatment for R achilles tendinitis pain. Patient demonstrates only mild ROM deficits in R ankle as well as weakness in plantar flexion due to pain at achilles insertion site.  Palpation finds tenderness to achilles insertion site as well as R soleus muscle.  Patient is a good candidate for OPPT incorporating TPDN to relieve symptoms and regain function.  OBJECTIVE IMPAIRMENTS: Abnormal gait, decreased activity tolerance, decreased mobility, difficulty walking, decreased ROM, decreased strength, and pain.   ACTIVITY LIMITATIONS: standing, squatting, and stairs  PERSONAL FACTORS: Age, Fitness, and Time since onset of injury/illness/exacerbation are also affecting patient's functional outcome.   REHAB POTENTIAL: Good  CLINICAL DECISION MAKING: Stable/uncomplicated  EVALUATION COMPLEXITY: Low   GOALS: Goals reviewed with patient? No  SHORT TERM GOALS: Target date: 07/22/2023   Patient to demonstrate independence in HEP  Baseline: [id number] Goal status: INITIAL   LONG TERM GOALS: Target date: 08/12/2023    Increase AROM R DF to 15d Baseline:  A/PROM Right eval Left eval  Hip flexion    Hip extension    Hip abduction    Hip adduction    Hip internal rotation    Hip external rotation    Knee flexion    Knee extension    Ankle dorsiflexion 10/18d    Goal status: INITIAL  2.   Increase R PF strength to 4+/5 Baseline:  MMT Right eval Left eval  Hip flexion    Hip extension    Hip abduction    Hip adduction    Hip internal rotation    Hip external rotation  Knee flexion    Knee extension    Ankle dorsiflexion 5   Ankle plantarflexion 4 P!   Ankle inversion 5   Ankle eversion 5    Goal status: INITIAL  3.  Increase LEFS score to 60/80 Baseline: 52/80 Goal status: INITIAL  4.  Decrease worst pain to 4/10 Baseline: 8/10 Goal status: INITIAL   PLAN:  PT FREQUENCY: 1-2x/week  PT DURATION: 6 weeks  PLANNED INTERVENTIONS: Therapeutic exercises, Therapeutic activity, Neuromuscular re-education, Balance training, Gait training, Patient/Family education, Self Care, Joint mobilization, Stair training, Aquatic Therapy, Dry Needling, Electrical stimulation, Cryotherapy, Moist heat, Manual therapy, and Re-evaluation  PLAN FOR NEXT SESSION: HEP review and update, manual techniques as appropriate, aerobic tasks, ROM and flexibility activities, strengthening and PREs, TPDN, gait and balance training as needed     Eloy End, PT 08/07/2023, 1:55 PM

## 2023-08-11 NOTE — Therapy (Unsigned)
OUTPATIENT PHYSICAL THERAPY TREATMENT/DC SUMMARY   Patient Name: Julie Duran MRN: [id number] DOB:January 31, 1946, 77 y.o., female Today's Date: 08/13/2023 PHYSICAL THERAPY DISCHARGE SUMMARY  Visits from Start of Care: 10  Current functional level related to goals / functional outcomes: Goals met   Remaining deficits: none   Education / Equipment: HEP   Patient agrees to discharge. Patient goals were met. Patient is being discharged due to being pleased with the current functional level.  END OF SESSION:  PT End of Session - 08/13/23 1455     Visit Number 10    Number of Visits 12    Date for PT Re-Evaluation 08/26/23    Authorization Type MCR    PT Start Time 1445    PT Stop Time 1525    PT Time Calculation (min) 40 min    Activity Tolerance Patient tolerated treatment well            Past Medical History:  Diagnosis Date   Bronchitis    Depression    Fibromyalgia    Past Surgical History:  Procedure Laterality Date   HAND SURGERY Left 2008   HAND SURGERY Right 2012   TOTAL ABDOMINAL HYSTERECTOMY  1982   Patient Active Problem List   Diagnosis Date Noted   Community acquired pneumonia 10/26/2022   Sepsis (HCC) 10/26/2022   Reactive airway disease 10/26/2022    PCP: Merri Brunette, MD   REFERRING PROVIDER: Louann Sjogren, DPM  REFERRING DIAG: M76.61 (ICD-10-CM) - Tendonitis, Achilles, right  THERAPY DIAG:  Muscle weakness (generalized)  Achilles tendinitis of right lower extremity  Other abnormalities of gait and mobility  Rationale for Evaluation and Treatment: Rehabilitation  ONSET DATE: 3+ months  SUBJECTIVE:   SUBJECTIVE STATEMENT: Has reported minimal symptoms since last session and over the past week.  Has resumed walking with her husband w/o setback. Rates herself at 85%  PERTINENT HISTORY: See PMH  PAIN:  Are you having pain?  Yes: NPRS scale: 1/10 Pain location: R achilles Pain description: ache, sharp Aggravating  factors: weightbearing tasks, walking, steps Relieving factors: rest  PRECAUTIONS: None  RED FLAGS: None   WEIGHT BEARING RESTRICTIONS: No  FALLS:  Has patient fallen in last 6 months? No  OCCUPATION: retired  PLOF: Independent  PATIENT GOALS: To manage my ankle symptoms  NEXT MD VISIT: 8 weeks  OBJECTIVE:   DIAGNOSTIC FINDINGS: none available  PATIENT SURVEYS:  LEFS 52/80; 08/13/23 66/80  MUSCLE LENGTH: deferred  POSTURE: No Significant postural limitations  PALPATION: TTP R soleus muscle with active trigger point noted as well as insertion site of achilles tendon distally  LOWER EXTREMITY ROM:  A/PROM Right eval Left eval  Hip flexion    Hip extension    Hip abduction    Hip adduction    Hip internal rotation    Hip external rotation    Knee flexion    Knee extension    Ankle dorsiflexion 10/18d   Ankle plantarflexion WFL   Ankle inversion WFL   Ankle eversion WFL    (Blank rows = not tested)  LOWER EXTREMITY MMT:  MMT Right eval Left eval  Hip flexion    Hip extension    Hip abduction    Hip adduction    Hip internal rotation    Hip external rotation    Knee flexion    Knee extension    Ankle dorsiflexion 5   Ankle plantarflexion 4 P!   Ankle inversion 5   Ankle eversion 5    (  Blank rows = not tested)  LOWER EXTREMITY SPECIAL TESTS:  Ankle special tests: Anterior drawer test: negative and Talar tilt test: negative  FUNCTIONAL TESTS:  5 times sit to stand: 13s  GAIT: Distance walked: 79ft x2 Assistive device utilized: None Level of assistance: Complete Independence Comments: decreased R toe off   TODAY'S TREATMENT:        [facility] Adult PT Treatment:                                                DATE: 08/13/23 Reassessment  Self Care:           Discussion of continued need for HEP and weaning down, benefit of compression hose and need to resume normal tasks/ADLs/recreational activities  [facility] Adult PT Treatment:                                                 DATE: 08/07/23 Therapeutic Exercise: Nustep L5 4 min while taking subjective Slant board gastroc stretch x 45" Slant board soleus stretch x 45" Standing calf stretch 2x30" R Eccentric heel lowering 2 up 1 down RLE 2x10 2in  RB A/P tilts 2x60" BUE Support Tandem stance 2x30" each Tandem stance on foam 2x30" each Step ups 2x10 - 8in Manual Therapy: Skilled palpation to identify taught and irritable bands in R medial gastroc head.  STM to R calf Trigger Point Dry Needling: Pre-treatment instruction: Patient instructed on dry needling rationale, procedures, and possible side effects including pain during treatment (achy,cramping feeling), bruising, drop of blood, lightheadedness, nausea, sweating. Patient Consent Given: Yes Education handout provided: No Muscles treated: R gastroc/soleus  Needle size and number: 0.25x69mm Electrical stimulation performed: N/A N/A Treatment response/outcome: Twitch response elicited, Palpable decrease in muscle tension, and trace blood loss Post-treatment instructions: Patient instructed to expect possible mild to moderate muscle soreness later today and/or tomorrow. Patient instructed in methods to reduce muscle soreness and to continue prescribed HEP. If patient was dry needled over the lung field, patient was instructed on signs and symptoms of pneumothorax and, however unlikely, to see immediate medical attention should they occur. Patient was also educated on signs and symptoms of infection and to seek medical attention should they occur. Patient verbalized understanding of these instructions and education.  [facility] Adult PT Treatment:                                                DATE: 08/05/23 Therapeutic Exercise: Nustep L5 4 min while taking subjective Slant board gastroc stretch x 45" Slant board soleus stretch x 45"  Eccentric heel lowering 2 up 1 down RLE 2x10 2in  RB A/P tilts 2x60" BUE Support Tandem stance 2x30"  each Step ups 2x10 - 8in Manual Therapy: Skilled palpation to identify taught and irritable bands in R medial gastroc head.  STM to R calf Trigger Point Dry Needling: Pre-treatment instruction: Patient instructed on dry needling rationale, procedures, and possible side effects including pain during treatment (achy,cramping feeling), bruising, drop of blood, lightheadedness, nausea, sweating. Patient Consent Given: Yes Education handout provided: No Muscles treated: R gastroc/soleus  Needle size and number: 0.25x39mm Electrical stimulation performed: N/A N/A Treatment response/outcome: Twitch response elicited, Palpable decrease in muscle tension, and trace blood loss Post-treatment instructions: Patient instructed to expect possible mild to moderate muscle soreness later today and/or tomorrow. Patient instructed in methods to reduce muscle soreness and to continue prescribed HEP. If patient was dry needled over the lung field, patient was instructed on signs and symptoms of pneumothorax and, however unlikely, to see immediate medical attention should they occur. Patient was also educated on signs and symptoms of infection and to seek medical attention should they occur. Patient verbalized understanding of these instructions and education.  [facility] Adult PT Treatment:                                                DATE: 07/31/23 Therapeutic Exercise: Nustep L5 8 min Runners step 6 in 15/15 Step up using RB 15x B Step downs from RB 15x B  RB A/P tilts 60s BUE Support RB M/L tilts 60s BUE Support Manual Therapy:  Skilled palpation to identify taught and irritable bands in R medial gastroc head.  Pre-treatment instruction: Patient instructed on dry needling rationale, procedures, and possible side effects including pain during treatment (achy,cramping feeling), bruising, drop of blood, lightheadedness, nausea, sweating. Patient Consent Given: Yes Education handout provided: No Muscles treated: R  gastroc/soleus  Needle size and number: 0.25x64mm Electrical stimulation performed: N/A N/A Treatment response/outcome: Twitch response elicited, Palpable decrease in muscle tension, and trace blood loss Post-treatment instructions: Patient instructed to expect possible mild to moderate muscle soreness later today and/or tomorrow. Patient instructed in methods to reduce muscle soreness and to continue prescribed HEP. If patient was dry needled over the lung field, patient was instructed on signs and symptoms of pneumothorax and, however unlikely, to see immediate medical attention should they occur. Patient was also educated on signs and symptoms of infection and to seek medical attention should they occur. Patient verbalized understanding of these instructions and education.   [facility] Adult PT Treatment:                                                DATE: 07/29/23 Therapeutic Exercise: Nustep L5 x 4 min while taking subjective Eccentric heel lowering (2 up, 1 down) 2x10 R 4in Slant board gastroc stretch 2x60" Slant board soleus stretch 2x60" Heel raise with ball 2x15 Wobble board fwd/bwd x 15 Manual Therapy: Skilled palpation to identify taught bands in R medial/lateral gastroc head IASTM and STM to R calf Trigger Point Dry Needling Treatment: Pre-treatment instruction: Patient instructed on dry needling rationale, procedures, and possible side effects including pain during treatment (achy,cramping feeling), bruising, drop of blood, lightheadedness, nausea, sweating. Patient Consent Given: Yes Education handout provided: No Muscles treated: R gastroc/soleus  Needle size and number: .30x37mm x 2, .30x6mm x 2 Electrical stimulation performed: N/A N/A Treatment response/outcome: Twitch response elicited, Palpable decrease in muscle tension, and trace blood loss Post-treatment instructions: Patient instructed to expect possible mild to moderate muscle soreness later today and/or tomorrow.  Patient instructed in methods to reduce muscle soreness and to continue prescribed HEP. If patient was dry needled over the lung field, patient was instructed on signs and symptoms of pneumothorax and, however  unlikely, to see immediate medical attention should they occur. Patient was also educated on signs and symptoms of infection and to seek medical attention should they occur. Patient verbalized understanding of these instructions and education.   [facility] Adult PT Treatment:                                                DATE: 07/24/23 Therapeutic Exercise: Nustep L5 x 4 min while taking subjective Eccentric heel lowering (2 up, 1 down) 2x10 R Slant board gastroc stretch 2x60" Slant board soleus stretch 2x60" Heel raise with ball 2x15 Manual Therapy: Skilled palpation to identify taught bands in R medial/lateral gastroc head IASTM to R medial gastroc Trigger Point Dry Needling Treatment: Pre-treatment instruction: Patient instructed on dry needling rationale, procedures, and possible side effects including pain during treatment (achy,cramping feeling), bruising, drop of blood, lightheadedness, nausea, sweating. Patient Consent Given: Yes Education handout provided: No Muscles treated: R gastroc/soleus  Needle size and number: .30x33mm x 2, .30x35mm x 2 Electrical stimulation performed: N/A N/A Treatment response/outcome: Twitch response elicited, Palpable decrease in muscle tension, and trace blood loss Post-treatment instructions: Patient instructed to expect possible mild to moderate muscle soreness later today and/or tomorrow. Patient instructed in methods to reduce muscle soreness and to continue prescribed HEP. If patient was dry needled over the lung field, patient was instructed on signs and symptoms of pneumothorax and, however unlikely, to see immediate medical attention should they occur. Patient was also educated on signs and symptoms of infection and to seek medical attention should  they occur. Patient verbalized understanding of these instructions and education.   PATIENT EDUCATION:  Education details: Discussed eval findings, rehab rationale and POC and patient is in agreement  Person educated: Patient Education method: Explanation Education comprehension: verbalized understanding and needs further education  HOME EXERCISE PROGRAM: Access Code: [email or web address] URL: [email or web address] Date: 07/01/2023 Prepared by: Gustavus Bryant  Exercises - Long Sitting Plantar Fascia Stretch with Towel  - 2 x daily - 5 x weekly - 1 sets - 2 reps - 30s hold - Soleus Stretch on Wall  - 2 x daily - 5 x weekly - 1 sets - 2 reps - 30s hold - Gastroc Stretch on Wall  - 2 x daily - 5 x weekly - 1 sets - 2 reps - 30s hold  ASSESSMENT:  CLINICAL IMPRESSION:  Rehab goal met, patient rates herself at 85% functional, no pain to report and feels confident to DC to independent management.  (Eval)Patient is a 77 y.o. female who was seen today for physical therapy evaluation and treatment for R achilles tendinitis pain. Patient demonstrates only mild ROM deficits in R ankle as well as weakness in plantar flexion due to pain at achilles insertion site.  Palpation finds tenderness to achilles insertion site as well as R soleus muscle.  Patient is a good candidate for OPPT incorporating TPDN to relieve symptoms and regain function.  OBJECTIVE IMPAIRMENTS: Abnormal gait, decreased activity tolerance, decreased mobility, difficulty walking, decreased ROM, decreased strength, and pain.   ACTIVITY LIMITATIONS: standing, squatting, and stairs  PERSONAL FACTORS: Age, Fitness, and Time since onset of injury/illness/exacerbation are also affecting patient's functional outcome.   REHAB POTENTIAL: Good  CLINICAL DECISION MAKING: Stable/uncomplicated  EVALUATION COMPLEXITY: Low   GOALS: Goals reviewed with patient? No  SHORT TERM GOALS: Target date: 07/22/2023   Patient  to demonstrate  independence in HEP  Baseline: [id number] Goal status: MET   LONG TERM GOALS: Target date: 08/12/2023    Increase AROM R DF to 15d Baseline:  A/PROM Right eval Left eval R 08/13/23  Hip flexion     Hip extension     Hip abduction     Hip adduction     Hip internal rotation     Hip external rotation     Knee flexion     Knee extension     Ankle dorsiflexion 10/18d         15/20d   Goal status: Met  2.  Increase R PF strength to 4+/5 Baseline:  MMT Right eval Left eval R 08/13/23  Hip flexion     Hip extension     Hip abduction     Hip adduction     Hip internal rotation     Hip external rotation     Knee flexion     Knee extension     Ankle dorsiflexion 5  5  Ankle plantarflexion 4 P!  5  Ankle inversion 5  5  Ankle eversion 5            5   Goal status: Met  3.  Increase LEFS score to 60/80 Baseline: 52/80; 08/13/23 Goal status: Met  4.  Decrease worst pain to 4/10 Baseline: 8/10; 08/13/23 0/10 Goal status: Met   PLAN:  PT FREQUENCY: 1-2x/week  PT DURATION: 6 weeks  PLANNED INTERVENTIONS: Therapeutic exercises, Therapeutic activity, Neuromuscular re-education, Balance training, Gait training, Patient/Family education, Self Care, Joint mobilization, Stair training, Aquatic Therapy, Dry Needling, Electrical stimulation, Cryotherapy, Moist heat, Manual therapy, and Re-evaluation  PLAN FOR NEXT SESSION: HEP review and update, manual techniques as appropriate, aerobic tasks, ROM and flexibility activities, strengthening and PREs, TPDN, gait and balance training as needed     Hildred Laser, PT 08/13/2023, 3:33 PM

## 2023-08-13 ENCOUNTER — Ambulatory Visit: Payer: Medicare Other

## 2023-08-13 DIAGNOSIS — M6281 Muscle weakness (generalized): Secondary | ICD-10-CM

## 2023-08-13 DIAGNOSIS — R2689 Other abnormalities of gait and mobility: Secondary | ICD-10-CM | POA: Diagnosis not present

## 2023-08-13 DIAGNOSIS — M7661 Achilles tendinitis, right leg: Secondary | ICD-10-CM | POA: Diagnosis not present

## 2023-08-27 ENCOUNTER — Ambulatory Visit (INDEPENDENT_AMBULATORY_CARE_PROVIDER_SITE_OTHER): Payer: Medicare Other | Admitting: Podiatry

## 2023-08-27 ENCOUNTER — Encounter: Payer: Self-pay | Admitting: Podiatry

## 2023-08-27 DIAGNOSIS — M7661 Achilles tendinitis, right leg: Secondary | ICD-10-CM | POA: Diagnosis not present

## 2023-08-27 NOTE — Progress Notes (Signed)
Subjective:  Patient ID: Julie Duran, female    DOB: Oct 16, 1946,   MRN: [id number]  No chief complaint on file.   77 y.o. female presents for follow-up of right achilles tendonitis. Relates was doing well with PT up until a couple weeks ago and the pain returned to just about where it was. Has continued to stretching but pain still present. Denies any other pedal complaints. Denies n/v/f/c.   Past Medical History:  Diagnosis Date   Bronchitis    Depression    Fibromyalgia     Objective:  Physical Exam: Vascular: DP/PT pulses 2/4 bilateral. CFT <3 seconds. Normal hair growth on digits. No edema.  Skin. No lacerations or abrasions bilateral feet.  Musculoskeletal: MMT 5/5 bilateral lower extremities in DF, PF, Inversion and Eversion. Deceased ROM in DF of ankle joint.  Tender to insertion of achilles tendon on right. Minimal pain with DF and PF. More so tender to medial insertion site near bursa.  Neurological: Sensation intact to light touch.   Assessment:   1. Tendonitis, Achilles, right        Plan:  Patient was evaluated and treated and all questions answered. -Xrays reviewed. No acute fracture or dislocations. Mild spurring noted to posterior calcaneus.  -Discussed Achilles insertional tendonitis and treatment options with patient.  -Continue stretching exercises and heel lifts.  Continue meloxicam.  -Will order MRI for further evaluation and possible surgical planning.  -Discussed if no improvement will consider surgery/EPAT/PRP injections.  -Patient to return to office after MRI     Louann Sjogren, DPM

## 2023-09-11 DIAGNOSIS — Z79899 Other long term (current) drug therapy: Secondary | ICD-10-CM | POA: Diagnosis not present

## 2023-09-11 DIAGNOSIS — F325 Major depressive disorder, single episode, in full remission: Secondary | ICD-10-CM | POA: Diagnosis not present

## 2023-09-11 DIAGNOSIS — Z23 Encounter for immunization: Secondary | ICD-10-CM | POA: Diagnosis not present

## 2023-09-11 DIAGNOSIS — M797 Fibromyalgia: Secondary | ICD-10-CM | POA: Diagnosis not present

## 2023-09-11 DIAGNOSIS — E7800 Pure hypercholesterolemia, unspecified: Secondary | ICD-10-CM | POA: Diagnosis not present

## 2023-09-11 DIAGNOSIS — F5101 Primary insomnia: Secondary | ICD-10-CM | POA: Diagnosis not present

## 2023-09-23 DIAGNOSIS — L570 Actinic keratosis: Secondary | ICD-10-CM | POA: Diagnosis not present

## 2023-09-23 DIAGNOSIS — D225 Melanocytic nevi of trunk: Secondary | ICD-10-CM | POA: Diagnosis not present

## 2023-09-23 DIAGNOSIS — L03011 Cellulitis of right finger: Secondary | ICD-10-CM | POA: Diagnosis not present

## 2023-09-23 DIAGNOSIS — L821 Other seborrheic keratosis: Secondary | ICD-10-CM | POA: Diagnosis not present

## 2023-09-23 DIAGNOSIS — D692 Other nonthrombocytopenic purpura: Secondary | ICD-10-CM | POA: Diagnosis not present

## 2023-09-23 DIAGNOSIS — L812 Freckles: Secondary | ICD-10-CM | POA: Diagnosis not present

## 2023-09-23 DIAGNOSIS — Z85828 Personal history of other malignant neoplasm of skin: Secondary | ICD-10-CM | POA: Diagnosis not present

## 2023-09-23 DIAGNOSIS — D1801 Hemangioma of skin and subcutaneous tissue: Secondary | ICD-10-CM | POA: Diagnosis not present

## 2023-10-11 ENCOUNTER — Ambulatory Visit
Admission: RE | Admit: 2023-10-11 | Discharge: 2023-10-11 | Disposition: A | Discharge status: Home / Self Care | Payer: Medicare Other | Source: Ambulatory Visit | Attending: Podiatry | Admitting: Podiatry

## 2023-10-11 DIAGNOSIS — M76821 Posterior tibial tendinitis, right leg: Secondary | ICD-10-CM | POA: Diagnosis not present

## 2023-10-11 DIAGNOSIS — M7731 Calcaneal spur, right foot: Secondary | ICD-10-CM | POA: Diagnosis not present

## 2023-10-11 DIAGNOSIS — R600 Localized edema: Secondary | ICD-10-CM | POA: Diagnosis not present

## 2023-10-11 DIAGNOSIS — M7661 Achilles tendinitis, right leg: Secondary | ICD-10-CM

## 2023-11-10 ENCOUNTER — Ambulatory Visit
Admission: RE | Admit: 2023-11-10 | Discharge: 2023-11-10 | Disposition: A | Discharge status: Home / Self Care | Payer: Medicare Other | Source: Ambulatory Visit | Attending: Family Medicine | Admitting: Family Medicine

## 2023-11-10 DIAGNOSIS — Z13820 Encounter for screening for osteoporosis: Secondary | ICD-10-CM

## 2023-11-10 DIAGNOSIS — E2839 Other primary ovarian failure: Secondary | ICD-10-CM | POA: Diagnosis not present

## 2023-11-10 DIAGNOSIS — N958 Other specified menopausal and perimenopausal disorders: Secondary | ICD-10-CM | POA: Diagnosis not present

## 2023-11-10 DIAGNOSIS — M8588 Other specified disorders of bone density and structure, other site: Secondary | ICD-10-CM | POA: Diagnosis not present

## 2023-12-02 ENCOUNTER — Ambulatory Visit (INDEPENDENT_AMBULATORY_CARE_PROVIDER_SITE_OTHER): Payer: Medicare Other | Admitting: Podiatry

## 2023-12-02 ENCOUNTER — Encounter: Payer: Self-pay | Admitting: Podiatry

## 2023-12-02 DIAGNOSIS — M7661 Achilles tendinitis, right leg: Secondary | ICD-10-CM | POA: Diagnosis not present

## 2023-12-02 NOTE — Progress Notes (Signed)
  Subjective:  Patient ID: Julie Duran, female    DOB: 1946-01-25,   MRN: [id number]  Chief Complaint  Patient presents with   Routine Post Op    Patient states the exercises made her right foot more and when she stopped it started to feel better. No medication for pain    77 y.o. female presents for follow-up of right achilles tendonitis. Relates  stretching was making it worse so has stopped. She is doing better.   Here to review MRI resutls.Denies any other pedal complaints. Denies n/v/f/c.   Past Medical History:  Diagnosis Date   Bronchitis    Depression    Fibromyalgia     Objective:  Physical Exam: Vascular: DP/PT pulses 2/4 bilateral. CFT <3 seconds. Normal hair growth on digits. No edema.  Skin. No lacerations or abrasions bilateral feet.  Musculoskeletal: MMT 5/5 bilateral lower extremities in DF, PF, Inversion and Eversion. Deceased ROM in DF of ankle joint.  Tender to insertion of achilles tendon on right. Minimal pain with DF and PF. More so tender to medial insertion site near bursa.  Neurological: Sensation intact to light touch.   MRI right heel   IMPRESSION: 1. Mild distal Achilles tendinopathy with fusiform expansion of the Achilles tendon, mild anterior convexity of the tendon contour, trace pre Achilles bursitis, and mild edema in the marrow of a spur like Haglund deformity. 2. Moderate distal tibialis posterior tendinopathy, correlate clinically in assessing for tibialis posterior dysfunction. 3. Longitudinal split tear of the peroneus brevis tendon posterior to the lateral malleolus with mild common peroneus tendon sheath tenosynovitis. 4. Mild flexor hallucis longus and flexor digitorum longus tenosynovitis at the knot of. 5. Mild dorsal spurring at the talonavicular articulation. 6. Small degenerative subcortical cystic lesion along the posterior margin of the distal fibula in the vicinity of the posterior talofibular ligament  attachment. 7. Incidental plantar calcaneal spur with some subcutaneous edema in the heel pad just below this plantar calcaneal spur. Assessment:   1. Tendonitis, Achilles, right         Plan:  Patient was evaluated and treated and all questions answered. -Xrays reviewed. No acute fracture or dislocations. Mild spurring noted to posterior calcaneus.  -Discussed Achilles insertional tendonitis and treatment options with patient.  -Continue stretching exercises and heel lifts.  Continue meloxicam .  -Reviewed MRI with patient. No major rupture or concerns.  -Discussed potential for surgery in future if needed but right now she is doing better so will hold off.  -Discussed if no improvement will consider surgery/EPAT/PRP injections.  -Patient to return to office as needed   Asberry Failing, DPM

## 2024-02-12 DIAGNOSIS — Z01419 Encounter for gynecological examination (general) (routine) without abnormal findings: Secondary | ICD-10-CM | POA: Diagnosis not present

## 2024-02-12 DIAGNOSIS — Z79890 Hormone replacement therapy (postmenopausal): Secondary | ICD-10-CM | POA: Diagnosis not present

## 2024-02-12 DIAGNOSIS — Z780 Asymptomatic menopausal state: Secondary | ICD-10-CM | POA: Diagnosis not present

## 2024-02-12 DIAGNOSIS — Z1231 Encounter for screening mammogram for malignant neoplasm of breast: Secondary | ICD-10-CM | POA: Diagnosis not present

## 2024-02-20 DIAGNOSIS — H26492 Other secondary cataract, left eye: Secondary | ICD-10-CM | POA: Diagnosis not present

## 2024-02-20 DIAGNOSIS — H53143 Visual discomfort, bilateral: Secondary | ICD-10-CM | POA: Diagnosis not present

## 2024-02-20 DIAGNOSIS — H43393 Other vitreous opacities, bilateral: Secondary | ICD-10-CM | POA: Diagnosis not present

## 2024-02-20 DIAGNOSIS — Z9841 Cataract extraction status, right eye: Secondary | ICD-10-CM | POA: Diagnosis not present

## 2024-04-07 DIAGNOSIS — Z23 Encounter for immunization: Secondary | ICD-10-CM | POA: Diagnosis not present

## 2024-04-07 DIAGNOSIS — F325 Major depressive disorder, single episode, in full remission: Secondary | ICD-10-CM | POA: Diagnosis not present

## 2024-04-07 DIAGNOSIS — E7800 Pure hypercholesterolemia, unspecified: Secondary | ICD-10-CM | POA: Diagnosis not present

## 2024-04-07 DIAGNOSIS — Z0000 Encounter for general adult medical examination without abnormal findings: Secondary | ICD-10-CM | POA: Diagnosis not present

## 2024-04-07 DIAGNOSIS — M797 Fibromyalgia: Secondary | ICD-10-CM | POA: Diagnosis not present

## 2024-04-07 DIAGNOSIS — F5101 Primary insomnia: Secondary | ICD-10-CM | POA: Diagnosis not present

## 2024-04-07 DIAGNOSIS — Z1331 Encounter for screening for depression: Secondary | ICD-10-CM | POA: Diagnosis not present

## 2024-09-29 DIAGNOSIS — L812 Freckles: Secondary | ICD-10-CM | POA: Diagnosis not present

## 2024-09-29 DIAGNOSIS — Z85828 Personal history of other malignant neoplasm of skin: Secondary | ICD-10-CM | POA: Diagnosis not present

## 2024-09-29 DIAGNOSIS — L821 Other seborrheic keratosis: Secondary | ICD-10-CM | POA: Diagnosis not present

## 2024-09-29 DIAGNOSIS — D1801 Hemangioma of skin and subcutaneous tissue: Secondary | ICD-10-CM | POA: Diagnosis not present

## 2024-10-05 DIAGNOSIS — Z79899 Other long term (current) drug therapy: Secondary | ICD-10-CM | POA: Diagnosis not present

## 2024-10-05 DIAGNOSIS — M79645 Pain in left finger(s): Secondary | ICD-10-CM | POA: Diagnosis not present

## 2024-10-05 DIAGNOSIS — M797 Fibromyalgia: Secondary | ICD-10-CM | POA: Diagnosis not present

## 2024-10-05 DIAGNOSIS — F5101 Primary insomnia: Secondary | ICD-10-CM | POA: Diagnosis not present

## 2024-10-05 DIAGNOSIS — M25512 Pain in left shoulder: Secondary | ICD-10-CM | POA: Diagnosis not present

## 2024-10-05 DIAGNOSIS — E7800 Pure hypercholesterolemia, unspecified: Secondary | ICD-10-CM | POA: Diagnosis not present

## 2024-10-05 DIAGNOSIS — E785 Hyperlipidemia, unspecified: Secondary | ICD-10-CM | POA: Diagnosis not present

## 2024-10-05 DIAGNOSIS — F325 Major depressive disorder, single episode, in full remission: Secondary | ICD-10-CM | POA: Diagnosis not present

## 2024-10-05 DIAGNOSIS — M1811 Unilateral primary osteoarthritis of first carpometacarpal joint, right hand: Secondary | ICD-10-CM | POA: Diagnosis not present

## 2024-10-05 DIAGNOSIS — Z23 Encounter for immunization: Secondary | ICD-10-CM | POA: Diagnosis not present

## 2024-10-07 DIAGNOSIS — M65311 Trigger thumb, right thumb: Secondary | ICD-10-CM | POA: Diagnosis not present

## 2024-10-07 DIAGNOSIS — M65322 Trigger finger, left index finger: Secondary | ICD-10-CM | POA: Diagnosis not present

## 2024-11-18 DIAGNOSIS — R0789 Other chest pain: Secondary | ICD-10-CM | POA: Diagnosis not present
